# Patient Record
Sex: Female | Born: 1975 | Race: White | Hispanic: No | Smoking: Never smoker
Health system: Southern US, Community
[De-identification: ages and names within clinical notes are randomized; demographics above are authoritative.]

## PROBLEM LIST (undated history)

## (undated) DIAGNOSIS — R0600 Dyspnea, unspecified: Secondary | ICD-10-CM

## (undated) DIAGNOSIS — E039 Hypothyroidism, unspecified: Secondary | ICD-10-CM

## (undated) DIAGNOSIS — L659 Nonscarring hair loss, unspecified: Secondary | ICD-10-CM

## (undated) DIAGNOSIS — J45909 Unspecified asthma, uncomplicated: Secondary | ICD-10-CM

## (undated) DIAGNOSIS — E739 Lactose intolerance, unspecified: Secondary | ICD-10-CM

## (undated) DIAGNOSIS — R0609 Other forms of dyspnea: Secondary | ICD-10-CM

## (undated) DIAGNOSIS — F32A Depression, unspecified: Secondary | ICD-10-CM

## (undated) DIAGNOSIS — J309 Allergic rhinitis, unspecified: Secondary | ICD-10-CM

## (undated) DIAGNOSIS — F419 Anxiety disorder, unspecified: Secondary | ICD-10-CM

## (undated) DIAGNOSIS — E669 Obesity, unspecified: Secondary | ICD-10-CM

## (undated) DIAGNOSIS — E282 Polycystic ovarian syndrome: Secondary | ICD-10-CM

## (undated) DIAGNOSIS — M549 Dorsalgia, unspecified: Secondary | ICD-10-CM

## (undated) DIAGNOSIS — F329 Major depressive disorder, single episode, unspecified: Secondary | ICD-10-CM

## (undated) DIAGNOSIS — K219 Gastro-esophageal reflux disease without esophagitis: Secondary | ICD-10-CM

## (undated) HISTORY — DX: Gastro-esophageal reflux disease without esophagitis: K21.9

## (undated) HISTORY — DX: Obesity, unspecified: E66.9

## (undated) HISTORY — DX: Hypothyroidism, unspecified: E03.9

## (undated) HISTORY — DX: Nonscarring hair loss, unspecified: L65.9

## (undated) HISTORY — DX: Other forms of dyspnea: R06.09

## (undated) HISTORY — DX: Allergic rhinitis, unspecified: J30.9

## (undated) HISTORY — DX: Dorsalgia, unspecified: M54.9

## (undated) HISTORY — DX: Depression, unspecified: F32.A

## (undated) HISTORY — DX: Polycystic ovarian syndrome: E28.2

## (undated) HISTORY — DX: Unspecified asthma, uncomplicated: J45.909

## (undated) HISTORY — DX: Major depressive disorder, single episode, unspecified: F32.9

## (undated) HISTORY — DX: Anxiety disorder, unspecified: F41.9

## (undated) HISTORY — DX: Dyspnea, unspecified: R06.00

## (undated) HISTORY — DX: Lactose intolerance, unspecified: E73.9

---

## 2017-09-09 ENCOUNTER — Encounter (INDEPENDENT_AMBULATORY_CARE_PROVIDER_SITE_OTHER): Payer: Self-pay

## 2017-10-01 ENCOUNTER — Ambulatory Visit (INDEPENDENT_AMBULATORY_CARE_PROVIDER_SITE_OTHER): Payer: BLUE CROSS/BLUE SHIELD | Admitting: Family Medicine

## 2017-10-01 ENCOUNTER — Encounter (INDEPENDENT_AMBULATORY_CARE_PROVIDER_SITE_OTHER): Payer: Self-pay | Admitting: Family Medicine

## 2017-10-01 VITALS — BP 109/74 | HR 69 | Temp 97.8°F | Ht 64.0 in | Wt 210.0 lb

## 2017-10-01 DIAGNOSIS — Z1331 Encounter for screening for depression: Secondary | ICD-10-CM | POA: Diagnosis not present

## 2017-10-01 DIAGNOSIS — Z6836 Body mass index (BMI) 36.0-36.9, adult: Secondary | ICD-10-CM

## 2017-10-01 DIAGNOSIS — R0602 Shortness of breath: Secondary | ICD-10-CM | POA: Diagnosis not present

## 2017-10-01 DIAGNOSIS — Z0289 Encounter for other administrative examinations: Secondary | ICD-10-CM

## 2017-10-01 DIAGNOSIS — F3289 Other specified depressive episodes: Secondary | ICD-10-CM | POA: Diagnosis not present

## 2017-10-01 DIAGNOSIS — R5383 Other fatigue: Secondary | ICD-10-CM | POA: Diagnosis not present

## 2017-10-01 NOTE — Progress Notes (Signed)
.  Office: [phone]  /  Fax: [phone]   HPI:   Chief Complaint: OBESITY  Tara Prince (MR# [id number]) is a 41 y.o. female who presents on 10/01/2017 for obesity evaluation and treatment. Current BMI is Body mass index is 36.05 kg/m.Marland Kitchen Tara Prince has struggled with obesity for years and has been unsuccessful in either losing weight or maintaining long term weight loss. Tara Prince attended our information session and states she is currently in the action stage of change and ready to dedicate time achieving and maintaining a healthier weight.  Tara Prince has been followed by an Environmental manager and has tried weight loss medications before (Contrave and [place]). She doesn't want to count calories as she feels this makes her OCD worse. She has massive guilt about eating and her weight. Tara Prince feels she had bulimia in the past. She is exercising for 60 minutes 5 times per week. Tara Prince states her family eats meals together she thinks her family will eat healthier with  her she struggles with family and or coworkers weight loss sabotage her desired weight loss is 55 lbs she has been heavy most of her life she started gaining weight in elementary school her heaviest weight ever was 215 ish lbs. she has significant food cravings issues  she frequently makes poor food choices she has problems with excessive hunger  she frequently eats larger portions than normal  she has binge eating behaviors she struggles with emotional eating    Fatigue Tara Prince feels her energy is lower than it should be. This has worsened with weight gain and has not worsened recently. [name] admits to daytime somnolence and admits to waking up still tired. Patient is at risk for obstructive sleep apnea. Patent has a history of symptoms of daytime fatigue and morning fatigue. Patient generally gets 6 or 7 hours of sleep per night, and states they generally have restless sleep. Snoring is present. Apneic episodes are not present.  Epworth Sleepiness Score is 9  Dyspnea on exertion Tara Prince notes increasing shortness of breath with exercising and seems to be worsening over time with weight gain. She notes getting out of breath sooner with activity than she used to. This has not gotten worse recently. Tara Prince denies orthopnea.  Depression with Anxiety Tara Prince is on medications and is followed by a therapist. She has lots of emotional eating behaviors and guilt associated with food. Tara Prince struggles with emotional eating and using food for comfort to the extent that it is negatively impacting her health. She often snacks when she is not hungry. Tara Prince sometimes feels she is out of control and then feels guilty that she made poor food choices. She has been working on behavior modification techniques to help reduce her emotional eating and has been somewhat successful. She shows no sign of suicidal or homicidal ideations.  Depression screen PHQ 2/9 10/01/2017  Decreased Interest 2  Down, Depressed, Hopeless 2  PHQ - 2 Score 4  Altered sleeping 3  Tired, decreased energy 3  Change in appetite 3  Feeling bad or failure about yourself  2  Trouble concentrating 1  Moving slowly or fidgety/restless 0  Suicidal thoughts 0  PHQ-9 Score 16  Difficult doing work/chores Somewhat difficult          Depression Screen Tara Prince's Food and Mood (modified PHQ-9) score was  Depression screen PHQ 2/9 10/01/2017  Decreased Interest 2  Down, Depressed, Hopeless 2  PHQ - 2 Score 4  Altered sleeping 3  Tired, decreased energy 3  Change  in appetite 3  Feeling bad or failure about yourself  2  Trouble concentrating 1  Moving slowly or fidgety/restless 0  Suicidal thoughts 0  PHQ-9 Score 16  Difficult doing work/chores Somewhat difficult    ALLERGIES: Allergies  Allergen Reactions  . Amoxicillin Rash  . Septra [Sulfamethoxazole-Trimethoprim] Rash    MEDICATIONS: No current outpatient prescriptions on file prior to visit.    No current facility-administered medications on file prior to visit.     PAST MEDICAL HISTORY: Past Medical History:  Diagnosis Date  . Allergic rhinitis   . Alopecia   . Anxiety   . Asthma   . Back pain   . Depression   . Dyspnea on exertion   . GERD (gastroesophageal reflux disease)   . Hypothyroidism   . Lactose intolerance   . Obesity   . PCOS (polycystic ovarian syndrome)     PAST SURGICAL HISTORY: History reviewed. No pertinent surgical history.  SOCIAL HISTORY: Social History  Substance Use Topics  . Smoking status: Never Smoker  . Smokeless tobacco: Never Used  . Alcohol use Not on file    FAMILY HISTORY: Family History  Problem Relation Age of Onset  . Hypertension Mother   . Thyroid disease Mother   . Sleep apnea Mother   . Eating disorder Mother   . Obesity Mother   . Hypertension Father   . Depression Father   . Anxiety disorder Father   . Alcoholism Father   . Obesity Father     ROS: Review of Systems  Constitutional: Positive for malaise/fatigue.  HENT: Positive for congestion (nasal stuffiness).        Nasal Discharge Hay Fever  Eyes:       Wear Glasses or Contacts Floaters  Respiratory: Positive for shortness of breath (on exertion) and wheezing.   Cardiovascular: Negative for orthopnea.       Calf Pain with Walking Very Cold Hands or Feet  Gastrointestinal: Positive for diarrhea and heartburn.  Genitourinary: Positive for frequency.  Musculoskeletal: Positive for back pain and neck pain.       Neck Stiffness Muscle or Joint Pain  Skin:       Dryness Hair or Nail Changes (Alopecia)   Neurological: Positive for headaches (tension).  Endo/Heme/Allergies: Bruises/bleeds easily (easy bruising).       Positive polyphagia Positive Heat/Cold Intolerance  Psychiatric/Behavioral: Positive for depression. Negative for suicidal ideas. The patient is nervous/anxious (nervousness) and has insomnia.        Stress    PHYSICAL  EXAM: Blood pressure 109/74, pulse 69, temperature 97.8 F (36.6 C), temperature source Oral, height  (1.626 m), weight 210 lb (95.3 kg), last menstrual period 09/06/2017, SpO2 96 %. Body mass index is 36.05 kg/m. Physical Exam  Constitutional: She is oriented to person, place, and time. She appears well-developed and well-nourished.  Cardiovascular: Normal rate.   Pulmonary/Chest: Effort normal.  Abdominal: Soft.  Musculoskeletal: Normal range of motion.  Neurological: She is oriented to person, place, and time.  Skin: Skin is warm and dry.  Vitals reviewed.   RECENT LABS AND TESTS: BMET No results found for: NA, K, CL, CO2, GLUCOSE, BUN, CREATININE, CALCIUM, GFRNONAA, GFRAA No results found for: HGBA1C No results found for: INSULIN CBC No results found for: WBC, RBC, HGB, HCT, PLT, MCV, MCH, MCHC, RDW, LYMPHSABS, MONOABS, EOSABS, BASOSABS Iron/TIBC/Ferritin/ %Sat No results found for: IRON, TIBC, FERRITIN, IRONPCTSAT Lipid Panel  No results found for: CHOL, TRIG, HDL, CHOLHDL, VLDL, LDLCALC, LDLDIRECT Hepatic Function  Panel  No results found for: PROT, ALBUMIN, AST, ALT, ALKPHOS, BILITOT, BILIDIR, IBILI No results found for: TSH  ECG  shows NSR with a rate of 71 BPM INDIRECT CALORIMETER done today shows a VO2 of 272 and a REE of 1892. Her calculated basal metabolic rate is 9147 thus her basal metabolic rate is better than expected.    ASSESSMENT AND PLAN: Other fatigue - Plan: EKG 12-Lead, Vitamin B12, CBC With Differential, Comprehensive metabolic panel, Folate, Hemoglobin A1c, Insulin, random, Lipid Panel With LDL/HDL Ratio, VITAMIN D 25 Hydroxy (Vit-D Deficiency, Fractures)  Shortness of breath on exertion  Other depression - with anxiety  Depression screening  Class 2 severe obesity with serious comorbidity and body mass index (BMI) of 36.0 to 36.9 in adult, unspecified obesity type (HCC)  PLAN:  Fatigue Oluwatosin was informed that her fatigue may be related  to obesity, depression or many other causes. Labs will be ordered, and in the meanwhile Kaydense has agreed to work on diet, exercise and weight loss to help with fatigue. Proper sleep hygiene was discussed including the need for 7-8 hours of quality sleep each night. A sleep study was not ordered based on symptoms and Epworth score.  Dyspnea on exertion Amoura's shortness of breath appears to be obesity related and exercise induced. She has agreed to work on weight loss and gradually increase exercise to treat her exercise induced shortness of breath. If Scarlettrose follows our instructions and loses weight without improvement of her shortness of breath, we will plan to refer to pulmonology. We will monitor this condition regularly. Tanyiah agrees to this plan.  Depression with Anxiety We discussed behavior modification techniques today to help Annaleia deal with her emotional eating and depression. She has agreed to continue with therapy and we discussed situations to watch for, which may trigger worsening depression /anxiety and resulting unrelenting eating. Edessa agreed to follow up as directed.  Depression Screen Lauraann had a strongly positive depression screening. Depression is commonly associated with obesity and often results in emotional eating behaviors. We will monitor this closely and work on CBT to help improve the non-hunger eating patterns. Referral to Psychology may be required if no improvement is seen as she continues in our clinic.  Obesity Takisha is currently in the action stage of change and her goal is to continue with weight loss efforts She has agreed to follow the Category 3 plan +100 calories Elanda has been instructed to work up to a goal of 150 minutes of combined cardio and strengthening exercise per week for weight loss and overall health benefits. We discussed the following Behavioral Modification Strategies today: increasing lean protein intake, decreasing simple carbohydrates  , work on meal planning and easy cooking plans and emotional eating strategies  Eleora has agreed to follow up with our clinic in 2 weeks. She was informed of the importance of frequent follow up visits to maximize her success with intensive lifestyle modifications for her multiple health conditions. She was informed we would discuss her lab results at her next visit unless there is a critical issue that needs to be addressed sooner. Aliana agreed to keep her next visit at the agreed upon time to discuss these results.  I, [place] Crane, am acting as transcriptionist for Quillian Quince, MD  I have reviewed the above documentation for accuracy and completeness, and I agree with the above. -Quillian Quince, MD   OBESITY BEHAVIORAL INTERVENTION VISIT  Today's visit was # 1 out of 22.  Starting weight:  210 lbs Starting date: 10/01/17 Today's weight : 210 lbs Today's date: 10/01/2017 Total lbs lost to date: 0 (Patients must lose 7 lbs in the first 6 months to continue with counseling)   ASK: We discussed the diagnosis of obesity with Kristeen Miss today and Dorina Hoyer agreed to give [place] permission to discuss obesity behavioral modification therapy today.  ASSESS: Brynlei has the diagnosis of obesity and her BMI today is 36.03 Toneshia is in the action stage of change   ADVISE: Jaree was educated on the multiple health risks of obesity as well as the benefit of weight loss to improve her health. She was advised of the need for long term treatment and the importance of lifestyle modifications.  AGREE: Multiple dietary modification options and treatment options were discussed and  Jackelin agreed to follow the Category 3 plan +100 calories We discussed the following Behavioral Modification Strategies today: increasing lean protein intake, decreasing simple carbohydrates , work on meal planning and easy cooking plans and emotional eating strategies

## 2017-10-02 LAB — CBC WITH DIFFERENTIAL
BASOS ABS: 0 10*3/uL (ref 0.0–0.2)
Basos: 1 %
EOS (ABSOLUTE): 0.1 10*3/uL (ref 0.0–0.4)
Eos: 1 %
HEMATOCRIT: 42.9 % (ref 34.0–46.6)
Hemoglobin: 13.8 g/dL (ref 11.1–15.9)
Immature Grans (Abs): 0 10*3/uL (ref 0.0–0.1)
Immature Granulocytes: 1 %
LYMPHS ABS: 2 10*3/uL (ref 0.7–3.1)
Lymphs: 31 %
MCH: 30.7 pg (ref 26.6–33.0)
MCHC: 32.2 g/dL (ref 31.5–35.7)
MCV: 96 fL (ref 79–97)
MONOCYTES: 9 %
Monocytes Absolute: 0.6 10*3/uL (ref 0.1–0.9)
NEUTROS ABS: 3.8 10*3/uL (ref 1.4–7.0)
Neutrophils: 57 %
RBC: 4.49 x10E6/uL (ref 3.77–5.28)
RDW: 12.9 % (ref 12.3–15.4)
WBC: 6.5 10*3/uL (ref 3.4–10.8)

## 2017-10-02 LAB — COMPREHENSIVE METABOLIC PANEL
A/G RATIO: 1.7 (ref 1.2–2.2)
ALBUMIN: 4.7 g/dL (ref 3.5–5.5)
ALK PHOS: 78 IU/L (ref 39–117)
ALT: 16 IU/L (ref 0–32)
AST: 22 IU/L (ref 0–40)
BILIRUBIN TOTAL: 0.4 mg/dL (ref 0.0–1.2)
BUN/Creatinine Ratio: 12 (ref 9–23)
BUN: 13 mg/dL (ref 6–24)
CHLORIDE: 99 mmol/L (ref 96–106)
CO2: 26 mmol/L (ref 20–29)
Calcium: 10.2 mg/dL (ref 8.7–10.2)
Creatinine, Ser: 1.06 mg/dL — ABNORMAL HIGH (ref 0.57–1.00)
GFR calc non Af Amer: 65 mL/min/{1.73_m2} (ref >59)
GFR, EST AFRICAN AMERICAN: 75 mL/min/{1.73_m2} (ref >59)
GLOBULIN, TOTAL: 2.7 g/dL (ref 1.5–4.5)
GLUCOSE: 90 mg/dL (ref 65–99)
POTASSIUM: 4.2 mmol/L (ref 3.5–5.2)
Sodium: 138 mmol/L (ref 134–144)
Total Protein: 7.4 g/dL (ref 6.0–8.5)

## 2017-10-02 LAB — LIPID PANEL WITH LDL/HDL RATIO
CHOLESTEROL TOTAL: 150 mg/dL (ref 100–199)
HDL: 55 mg/dL (ref >39)
LDL Calculated: 84 mg/dL (ref 0–99)
LDl/HDL Ratio: 1.5 ratio (ref 0.0–3.2)
Triglycerides: 55 mg/dL (ref 0–149)
VLDL Cholesterol Cal: 11 mg/dL (ref 5–40)

## 2017-10-02 LAB — INSULIN, RANDOM: INSULIN: 6.6 u[IU]/mL (ref 2.6–24.9)

## 2017-10-02 LAB — FOLATE

## 2017-10-02 LAB — VITAMIN D 25 HYDROXY (VIT D DEFICIENCY, FRACTURES): VIT D 25 HYDROXY: 74 ng/mL (ref 30.0–100.0)

## 2017-10-02 LAB — HEMOGLOBIN A1C
ESTIMATED AVERAGE GLUCOSE: 114 mg/dL
Hgb A1c MFr Bld: 5.6 % (ref 4.8–5.6)

## 2017-10-02 LAB — VITAMIN B12: Vitamin B-12: 929 pg/mL (ref 232–1245)

## 2017-10-05 ENCOUNTER — Encounter (INDEPENDENT_AMBULATORY_CARE_PROVIDER_SITE_OTHER): Payer: Self-pay | Admitting: Family Medicine

## 2017-10-14 ENCOUNTER — Ambulatory Visit (INDEPENDENT_AMBULATORY_CARE_PROVIDER_SITE_OTHER): Payer: BLUE CROSS/BLUE SHIELD | Admitting: Family Medicine

## 2017-10-14 VITALS — BP 117/80 | HR 65 | Temp 98.2°F | Ht 64.0 in | Wt 209.0 lb

## 2017-10-14 DIAGNOSIS — E88819 Insulin resistance, unspecified: Secondary | ICD-10-CM | POA: Insufficient documentation

## 2017-10-14 DIAGNOSIS — Z6836 Body mass index (BMI) 36.0-36.9, adult: Secondary | ICD-10-CM

## 2017-10-14 DIAGNOSIS — E559 Vitamin D deficiency, unspecified: Secondary | ICD-10-CM

## 2017-10-14 DIAGNOSIS — E8881 Metabolic syndrome: Secondary | ICD-10-CM | POA: Diagnosis not present

## 2017-10-14 NOTE — Progress Notes (Signed)
Office: [phone]  /  Fax: [phone]   HPI:   Chief Complaint: OBESITY Tara Prince is here to discuss her progress with her obesity treatment plan. She is on the Category 3 plan and is following her eating plan approximately 90 to 95 % of the time. She states she is exercising [employer] and running for 60 minutes 4 to 5 times per week. Tara Prince struggled to follow her category 3 plan due to excessive hunger every day. She didn't like eating all her dairy and not all the protein in the pm (only ate approximately half of her dinner portion). Her weight is 209 lb (94.8 kg) today and has had a weight loss of 1 pound over a period of 2 weeks since her last visit. She has lost 1 lb since starting treatment with [place].  Vitamin D deficiency Tara Prince has a diagnosis of vitamin D deficiency. She is currently taking OTC vit D 5,000 IU daily and level is at goal. She still notes mild fatigue, likely unrelated to vitamin D and she denies nausea, vomiting or muscle weakness.  Insulin Resistance Tara Prince has a diagnosis of insulin resistance based on her very mildly elevated fasting insulin level >5 and positive family history of diabetes. Tara Prince is doing well with diet and exercise. Although Tara Prince's blood glucose readings are still under good control, insulin resistance puts her at greater risk of metabolic syndrome and diabetes. Tara Prince admits polyphagia. She is not taking metformin currently and continues to work on diet and exercise to decrease risk of diabetes.  ALLERGIES: Allergies  Allergen Reactions  . Amoxicillin Rash  . Septra [Sulfamethoxazole-Trimethoprim] Rash    MEDICATIONS: Current Outpatient Prescriptions on File Prior to Visit  Medication Sig Dispense Refill  . Ascorbic Acid (VITAMIN C) 1000 MG tablet Take 1,000 mg by mouth daily.    . B Complex Vitamins (B COMPLEX 100 PO) Take 1 tablet by mouth daily.    . beclomethasone (QVAR) 80 MCG/ACT inhaler Inhale 1 puff into the lungs 2  (two) times daily.    . busPIRone (BUSPAR) 5 MG tablet Take 5 mg by mouth daily as needed.    . Cholecalciferol (VITAMIN D3) 5000 units CAPS Take 1 capsule by mouth daily.    Marland Kitchen escitalopram (LEXAPRO) 20 MG tablet Take 20 mg by mouth daily.    Marland Kitchen levocetirizine (XYZAL) 5 MG tablet Take 5 mg by mouth every evening.    . Magnesium 500 MG CAPS Take 1 capsule by mouth daily.    . meloxicam (MOBIC) 15 MG tablet Take 15 mg by mouth daily.    . montelukast (SINGULAIR) 10 MG tablet Take 10 mg by mouth at bedtime.    . progesterone (PROMETRIUM) 200 MG capsule Take 200 mg by mouth daily.    . Quercetin 250 MG TABS Take 1 tablet by mouth daily.    Marland Kitchen spironolactone (ALDACTONE) 100 MG tablet Take 100 mg by mouth 2 (two) times daily.    . Thyroid 81.25 MG TABS Take 81.25 mg by mouth daily.    . TURMERIC CURCUMIN PO Take 400 mg by mouth daily.     No current facility-administered medications on file prior to visit.     PAST MEDICAL HISTORY: Past Medical History:  Diagnosis Date  . Allergic rhinitis   . Alopecia   . Anxiety   . Asthma   . Back pain   . Depression   . Dyspnea on exertion   . GERD (gastroesophageal reflux disease)   . Hypothyroidism   .  Lactose intolerance   . Obesity   . PCOS (polycystic ovarian syndrome)     PAST SURGICAL HISTORY: No past surgical history on file.  SOCIAL HISTORY: Social History  Substance Use Topics  . Smoking status: Never Smoker  . Smokeless tobacco: Never Used  . Alcohol use Not on file    FAMILY HISTORY: Family History  Problem Relation Age of Onset  . Hypertension Mother   . Thyroid disease Mother   . Sleep apnea Mother   . Eating disorder Mother   . Obesity Mother   . Hypertension Father   . Depression Father   . Anxiety disorder Father   . Alcoholism Father   . Obesity Father     ROS: Review of Systems  Constitutional: Positive for malaise/fatigue and weight loss.  Gastrointestinal: Negative for nausea and vomiting.    Musculoskeletal:       Negative muscle weakness    PHYSICAL EXAM: Blood pressure 117/80, pulse 65, temperature 98.2 F (36.8 C), temperature source Oral, height  (1.626 m), weight 209 lb (94.8 kg), last menstrual period 09/06/2017, SpO2 99 %. Body mass index is 35.87 kg/m. Physical Exam  Constitutional: She is oriented to person, place, and time. She appears well-developed and well-nourished.  Cardiovascular: Normal rate.   Pulmonary/Chest: Effort normal.  Musculoskeletal: Normal range of motion.  Neurological: She is oriented to person, place, and time.  Skin: Skin is warm and dry.  Psychiatric: She has a normal mood and affect. Her behavior is normal.  Vitals reviewed.   RECENT LABS AND TESTS: BMET    Component Value Date/Time   NA 138 10/01/2017 1035   K 4.2 10/01/2017 1035   CL 99 10/01/2017 1035   CO2 26 10/01/2017 1035   GLUCOSE 90 10/01/2017 1035   BUN 13 10/01/2017 1035   CREATININE 1.06 (H) 10/01/2017 1035   CALCIUM 10.2 10/01/2017 1035   GFRNONAA 65 10/01/2017 1035   GFRAA 75 10/01/2017 1035   Lab Results  Component Value Date   HGBA1C 5.6 10/01/2017   Lab Results  Component Value Date   INSULIN 6.6 10/01/2017   CBC    Component Value Date/Time   WBC 6.5 10/01/2017 1035   RBC 4.49 10/01/2017 1035   HGB 13.8 10/01/2017 1035   HCT 42.9 10/01/2017 1035   MCV 96 10/01/2017 1035   MCH 30.7 10/01/2017 1035   MCHC 32.2 10/01/2017 1035   RDW 12.9 10/01/2017 1035   LYMPHSABS 2.0 10/01/2017 1035   EOSABS 0.1 10/01/2017 1035   BASOSABS 0.0 10/01/2017 1035   Iron/TIBC/Ferritin/ %Sat No results found for: IRON, TIBC, FERRITIN, IRONPCTSAT Lipid Panel     Component Value Date/Time   CHOL 150 10/01/2017 1035   TRIG 55 10/01/2017 1035   HDL 55 10/01/2017 1035   LDLCALC 84 10/01/2017 1035   Hepatic Function Panel     Component Value Date/Time   PROT 7.4 10/01/2017 1035   ALBUMIN 4.7 10/01/2017 1035   AST 22 10/01/2017 1035   ALT 16 10/01/2017  1035   ALKPHOS 78 10/01/2017 1035   BILITOT 0.4 10/01/2017 1035   No results found for: TSH  ASSESSMENT AND PLAN: Insulin resistance  Vitamin D deficiency  Class 2 severe obesity with serious comorbidity and body mass index (BMI) of 36.0 to 36.9 in adult, unspecified obesity type (HCC)  PLAN:  Vitamin D Deficiency Tara Prince was informed that low vitamin D levels contributes to fatigue and are associated with obesity, breast, and colon cancer. She agrees to  continue to take OTC vitamin D 5,000 IU daily for now. We will recheck labs in 3 months and will follow up for routine testing of vitamin D, at least 2-3 times per year. She was informed of the risk of over-replacement of vitamin D and agrees to not increase her dose unless he discusses this with [place] first. We may need to decrease dose if her level continues to increase.  Insulin Resistance Tara Prince will continue to work on weight loss, exercise, and decreasing simple carbohydrates in her diet to help decrease the risk of diabetes. We dicussed metformin including benefits and risks. She was informed that eating too many simple carbohydrates or too many calories at one sitting increases the likelihood of GI side effects. We will adjust her plan to help with hunger and defer metformin for now and prescription was not written today but may need to start if polyphagia doesn't improve. Tara Prince agreed to follow up with [place] as directed to monitor her progress.  Obesity Tara Prince is currently in the action stage of change. As such, her goal is to continue with weight loss efforts She has agreed to follow the modified Category 3 plan with different protein options Daizee has been instructed to work up to a goal of 150 minutes of combined cardio and strengthening exercise per week or continue [employer] and running for 60 minutes 4 to 5 times per week for weight loss and overall health benefits. We discussed the following Behavioral Modification  Strategies today: keeping healthy foods in the home, increasing lean protein intake, decreasing simple carbohydrates and work on meal planning and easy cooking plans  Tara Prince has agreed to follow up with our clinic in 2 weeks. She was informed of the importance of frequent follow up visits to maximize her success with intensive lifestyle modifications for her multiple health conditions.  I, [place] Crane, am acting as transcriptionist for Quillian Quince, MD  I have reviewed the above documentation for accuracy and completeness, and I agree with the above. -Quillian Quince, MD   OBESITY BEHAVIORAL INTERVENTION VISIT  Today's visit was # 2 out of 22.  Starting weight: 210 lbs Starting date: 10/01/17 Today's weight : 209 lbs Today's date: 10/14/2017 Total lbs lost to date: 1 (Patients must lose 7 lbs in the first 6 months to continue with counseling)   ASK: We discussed the diagnosis of obesity with Tara Prince today and Tara Prince agreed to give [place] permission to discuss obesity behavioral modification therapy today.  ASSESS: Tara Prince has the diagnosis of obesity and her BMI today is 35.86 Tara Prince is in the action stage of change   ADVISE: Tara Prince was educated on the multiple health risks of obesity as well as the benefit of weight loss to improve her health. She was advised of the need for long term treatment and the importance of lifestyle modifications.  AGREE: Multiple dietary modification options and treatment options were discussed and  Tara Prince agreed to follow the modified Category 3 plan with different protein options We discussed the following Behavioral Modification Strategies today: keeping healthy foods in the home, increasing lean protein intake, decreasing simple carbohydrates  and work on meal planning and easy cooking plans

## 2017-10-16 ENCOUNTER — Telehealth: Payer: Self-pay

## 2017-10-16 ENCOUNTER — Telehealth: Payer: Self-pay | Admitting: *Deleted

## 2017-10-16 ENCOUNTER — Encounter: Payer: Self-pay | Admitting: Allergy and Immunology

## 2017-10-16 ENCOUNTER — Ambulatory Visit (INDEPENDENT_AMBULATORY_CARE_PROVIDER_SITE_OTHER): Payer: BLUE CROSS/BLUE SHIELD | Admitting: Allergy and Immunology

## 2017-10-16 VITALS — BP 118/80 | HR 72 | Temp 98.0°F | Resp 16 | Ht 64.69 in | Wt 212.3 lb

## 2017-10-16 DIAGNOSIS — R0609 Other forms of dyspnea: Secondary | ICD-10-CM | POA: Insufficient documentation

## 2017-10-16 DIAGNOSIS — J4540 Moderate persistent asthma, uncomplicated: Secondary | ICD-10-CM

## 2017-10-16 DIAGNOSIS — J3089 Other allergic rhinitis: Secondary | ICD-10-CM

## 2017-10-16 MED ORDER — MOMETASONE FUROATE 50 MCG/ACT NA SUSP: NASAL | 5 refills | Status: DC

## 2017-10-16 MED ORDER — BUDESONIDE-FORMOTEROL FUMARATE 160-4.5 MCG/ACT IN AERO: 2.0000 | INHALATION_SPRAY | Freq: Two times a day (BID) | RESPIRATORY_TRACT | 5 refills | Status: DC

## 2017-10-16 NOTE — Telephone Encounter (Signed)
Patient informed of change of medication

## 2017-10-16 NOTE — Telephone Encounter (Signed)
-----   Message from Cristal Fordalph Carter Bobbitt, MD sent at 10/16/2017  1:23 PM EDT ----- Please call the patient and inform her that Elwin SleightDulera is not covered by her insurance so we have instead written a prescription for Symbicort 160-4.5 g, 2 inhalations via spacer device twice a day.  Thank you.

## 2017-10-16 NOTE — Assessment & Plan Note (Signed)
   Aeroallergen avoidance measures have been discussed and provided in written form.  A prescription has been provided for levocetirizine, 5mg  daily as needed.  To avoid diminishing benefit with daily use (tachyphylaxis) of second generation antihistamine, consider alternating every few months between fexofenadine (Allegra) and levocetirizine (Xyzal).  A prescription has been provided for Nasonex nasal spray, one spray per nostril 1-2 times daily as needed. Proper nasal spray technique has been discussed and demonstrated.  I have also recommended nasal saline spray (i.e., Simply Saline) or nasal saline lavage (i.e., NeilMed) as needed and prior to medicated nasal sprays.  If allergen avoidance measures and medications fail to adequately relieve symptoms, aeroallergen immunotherapy will be considered.

## 2017-10-16 NOTE — Progress Notes (Signed)
New Patient Note  RE: Tara Prince MRN: [id number] DOB: [birth year] Date of Office Visit: 10/16/2017  Referring provider: Isabella Bowens, PA* Primary care provider: Isabella Bowens, PA-C  Chief Complaint: Breathing Problem and Allergic Rhinitis    History of present illness: Tara Prince is a 41 y.o. female seen today in consultation requested by Tara Bowens, PA-C. She reports that over the past 3 years she has experienced dyspnea, coughing, and occasional wheezing.  These lower respiratory symptoms are consistently triggered by exercise and are more pronounced in hot and cold weather.  Earlier this year, she had 2 "asthma attacks" during hot weather with speed training workouts.  Albuterol provides temporary relief.  At times, she has difficulty "getting enough air in" her lungs and feels like she has to repeatedly yawn in an attempt to do so.  She is currently taking montelukast 10 mg daily and albuterol HFA prior to exercise and as needed.  She has briefly tried Qvar and Breo Ellipta (dry powder inhaler) and Advair (dry powder inhaler) without adequate symptom relief. [name] experiences "chronic runny nose", as well as nasal congestion, thick postnasal drainage, sneezing, and nasal pruritus.  The symptoms occur year round and may be triggered by exposure to dander.  She is currently taking loratadine 20 mg daily, and has done so on a daily basis for the past few years, without adequate symptom relief.   Assessment and plan: Asthma/exercise-induced bronchospasm Currently with suboptimal control.  A prescription has been provided for [facility] (mometasone/formoterol) 200/5 g,  2 inhalations twice a day. To maximize pulmonary deposition, a spacer has been provided along with instructions for its proper administration with an HFA inhaler.  For now, continue montelukast 10 mg daily at bedtime and albuterol HFA, 1-2 inhalations every 4-6 hours if needed and 15 minutes prior to  exercise.  Subjective and objective measures of pulmonary function will be followed and the treatment plan will be adjusted accordingly.  Other form of dyspnea The patient's sensation of air-hunger, not being able to get a full breath on inspiration, which is relieved by a yawn suggests sighing dyspnea. Less likely is vocal cord dysfunction.   Diaphragmatic breathing, or belly breathing, has been discussed with the patient as this technique often times relieves sighing dyspnea.  Continue to have access to albuterol HFA, if needed.  Allergic rhinitis  Aeroallergen avoidance measures have been discussed and provided in written form.  A prescription has been provided for levocetirizine, 5mg  daily as needed.  To avoid diminishing benefit with daily use (tachyphylaxis) of second generation antihistamine, consider alternating every few months between fexofenadine (Allegra) and levocetirizine (Xyzal).  A prescription has been provided for Nasonex nasal spray, one spray per nostril 1-2 times daily as needed. Proper nasal spray technique has been discussed and demonstrated.  I have also recommended nasal saline spray (i.e., Simply Saline) or nasal saline lavage (i.e., NeilMed) as needed and prior to medicated nasal sprays.  If allergen avoidance measures and medications fail to adequately relieve symptoms, aeroallergen immunotherapy will be considered.   Meds ordered this encounter  Medications  . mometasone (NASONEX) 50 MCG/ACT nasal spray    Sig: One spray in each nostril 1-2 times a day as needed    Dispense:  17 g    Refill:  5  . budesonide-formoterol (SYMBICORT) 160-4.5 MCG/ACT inhaler    Sig: Inhale 2 puffs into the lungs 2 (two) times daily.    Dispense:  1 Inhaler    Refill:  5    Diagnostics: Spirometry: FVC is 4.05 L and FEV1 is 3.09 L with an FEV1 ratio of 94%.  There is not significant postbronchodilator improvement. This study was performed while the patient was  asymptomatic.  Please see scanned spirometry results for details. Epicutaneous testing:  Negative despite a positive histamine control. Intradermal testing: Positive to molds, dog epithelia, cockroach antigen, and dust mite antigen.    Physical examination: Blood pressure 118/80, pulse 72, temperature 98 F (36.7 C), temperature source Oral, resp. rate 16, height 5' 4.69" (1.643 m), weight 212 lb 4.9 oz (96.3 kg), SpO2 98 %.  General: Alert, interactive, in no acute distress. HEENT: TMs pearly gray, turbinates edematous with clear discharge, post-pharynx moderately erythematous. Neck: Supple without lymphadenopathy. Lungs: Clear to auscultation without wheezing, rhonchi or rales. CV: Normal S1, S2 without murmurs. Abdomen: Nondistended, nontender. Skin: Warm and dry, without lesions or rashes. Extremities:  No clubbing, cyanosis or edema. Neuro:   Grossly intact.  Review of systems:  Review of systems negative except as noted in HPI / PMHx or noted below: Review of Systems  Constitutional: Negative.   HENT: Negative.   Eyes: Negative.   Respiratory: Negative.   Cardiovascular: Negative.   Gastrointestinal: Negative.   Genitourinary: Negative.   Musculoskeletal: Negative.   Skin: Negative.   Neurological: Negative.   Endo/Heme/Allergies: Negative.   Psychiatric/Behavioral: Negative.     Past medical history:  Past Medical History:  Diagnosis Date  . Allergic rhinitis   . Alopecia   . Anxiety   . Asthma   . Back pain   . Depression   . Dyspnea on exertion   . GERD (gastroesophageal reflux disease)   . Hypothyroidism   . Lactose intolerance   . Obesity   . PCOS (polycystic ovarian syndrome)     Past surgical history:  History reviewed. No pertinent surgical history.  Family history: Family History  Problem Relation Age of Onset  . Hypertension Mother   . Thyroid disease Mother   . Sleep apnea Mother   . Eating disorder Mother   . Obesity Mother   .  Hypertension Father   . Depression Father   . Anxiety disorder Father   . Alcoholism Father   . Obesity Father     Social history: Social History   Social History  . Marital status: Media plannerDomestic Partner    Spouse name: Wiliam  . Number of children: N/A  . Years of education: N/A   Occupational History  . Compliance officer [employer]   Social History Main Topics  . Smoking status: Never Smoker  . Smokeless tobacco: Never Used  . Alcohol use Not on file  . Drug use: Unknown  . Sexual activity: Not on file   Other Topics Concern  . Not on file   Social History Narrative  . No narrative on file   Environmental History: The patient lives in a 41 year old townhouse with carpeting throughout, gas heat, and central air.  There is no known mold/water damage in the home.  She is a nonsmoker.  There is a dog in the townhouse which has access to her bedroom.  Allergies as of 10/16/2017      Reactions   Amoxicillin Rash   Septra [sulfamethoxazole-trimethoprim] Rash      Medication List       Accurate as of 10/16/17  1:26 PM. Always use your most recent med list.          albuterol 108 (90 Base) MCG/ACT  inhaler Commonly known as:  PROVENTIL HFA;VENTOLIN HFA Inhale into the lungs.   B COMPLEX 100 PO Take 1 tablet by mouth daily.   beclomethasone 80 MCG/ACT inhaler Commonly known as:  QVAR Inhale 1 puff into the lungs 2 (two) times daily.   beclomethasone 80 MCG/ACT inhaler Commonly known as:  QVAR Inhale into the lungs.   Biotin 1000 MCG tablet Take by mouth.   budesonide-formoterol 160-4.5 MCG/ACT inhaler Commonly known as:  SYMBICORT Inhale 2 puffs into the lungs 2 (two) times daily.   busPIRone 5 MG tablet Commonly known as:  BUSPAR Take 5 mg by mouth daily as needed.   escitalopram 20 MG tablet Commonly known as:  LEXAPRO Take 20 mg by mouth daily.   levocetirizine 5 MG tablet Commonly known as:  XYZAL Take 5 mg by mouth every evening.     loratadine 10 MG tablet Commonly known as:  CLARITIN Take 20 mg by mouth.   Magnesium 500 MG Caps Take 1 capsule by mouth daily.   meloxicam 15 MG tablet Commonly known as:  MOBIC Take 15 mg by mouth daily.   mometasone 50 MCG/ACT nasal spray Commonly known as:  NASONEX One spray in each nostril 1-2 times a day as needed   montelukast 10 MG tablet Commonly known as:  SINGULAIR Take 10 mg by mouth at bedtime.   MULTI-VITAMINS Tabs Take by mouth.   progesterone 200 MG capsule Commonly known as:  PROMETRIUM Take 200 mg by mouth daily.   Quercetin 250 MG Tabs Take 1 tablet by mouth daily.   spironolactone 100 MG tablet Commonly known as:  ALDACTONE Take 100 mg by mouth 2 (two) times daily.   Thyroid 81.25 MG Tabs Take 81.25 mg by mouth daily.   TURMERIC CURCUMIN PO Take 400 mg by mouth daily.   vitamin C 1000 MG tablet Take 1,000 mg by mouth daily.   Vitamin D3 5000 units Caps Take 1 capsule by mouth daily.       Known medication allergies: Allergies  Allergen Reactions  . Amoxicillin Rash  . Septra [Sulfamethoxazole-Trimethoprim] Rash    I appreciate the opportunity to take part in Zionah's care. Please do not hesitate to contact me with questions.  Sincerely,   R. Jorene Guest, MD

## 2017-10-16 NOTE — Telephone Encounter (Signed)
-----   Message from Ralph Carter Bobbitt, MD sent at 10/16/2017  1:23 PM EDT ----- Please call the patient and inform her that Dulera is not covered by her insurance so we have instead written a prescription for Symbicort 160-4.5 g, 2 inhalations via spacer device twice a day.  Thank you. 

## 2017-10-16 NOTE — Assessment & Plan Note (Signed)
The patient's sensation of air-hunger, not being able to get a full breath on inspiration, which is relieved by a yawn suggests sighing dyspnea. Less likely is vocal cord dysfunction.   Diaphragmatic breathing, or belly breathing, has been discussed with the patient as this technique often times relieves sighing dyspnea.  Continue to have access to albuterol HFA, if needed.

## 2017-10-16 NOTE — Patient Instructions (Addendum)
Asthma/exercise-induced bronchospasm Currently with suboptimal control.  A prescription has been provided for [facility] (mometasone/formoterol) 200/5 g,  2 inhalations twice a day. To maximize pulmonary deposition, a spacer has been provided along with instructions for its proper administration with an HFA inhaler.  For now, continue montelukast 10 mg daily at bedtime and albuterol HFA, 1-2 inhalations every 4-6 hours if needed and 15 minutes prior to exercise.  Subjective and objective measures of pulmonary function will be followed and the treatment plan will be adjusted accordingly.  Other form of dyspnea The patient's sensation of air-hunger, not being able to get a full breath on inspiration, which is relieved by a yawn suggests sighing dyspnea. Less likely is vocal cord dysfunction.   Diaphragmatic breathing, or belly breathing, has been discussed with the patient as this technique often times relieves sighing dyspnea.  Continue to have access to albuterol HFA, if needed.  Allergic rhinitis  Aeroallergen avoidance measures have been discussed and provided in written form.  A prescription has been provided for levocetirizine, 5mg  daily as needed.  To avoid diminishing benefit with daily use (tachyphylaxis) of second generation antihistamine, consider alternating every few months between fexofenadine (Allegra) and levocetirizine (Xyzal).  A prescription has been provided for Nasonex nasal spray, one spray per nostril 1-2 times daily as needed. Proper nasal spray technique has been discussed and demonstrated.  I have also recommended nasal saline spray (i.e., Simply Saline) or nasal saline lavage (i.e., NeilMed) as needed and prior to medicated nasal sprays.  If allergen avoidance measures and medications fail to adequately relieve symptoms, aeroallergen immunotherapy will be considered.   Return in about 3 months (around 01/16/2018), or if symptoms worsen or fail to  improve.   Control of House Dust Mite Allergen  House dust mites play a major role in allergic asthma and rhinitis.  They occur in environments with high humidity wherever human skin, the food for dust mites is found. High levels have been detected in dust obtained from mattresses, pillows, carpets, upholstered furniture, bed covers, clothes and soft toys.  The principal allergen of the house dust mite is found in its feces.  A gram of dust may contain 1,000 mites and 250,000 fecal particles.  Mite antigen is easily measured in the air during house cleaning activities.    1. Encase mattresses, including the box spring, and pillow, in an air tight cover.  Seal the zipper end of the encased mattresses with wide adhesive tape. 2. Wash the bedding in water of 130 degrees Farenheit weekly.  Avoid cotton comforters/quilts and flannel bedding: the most ideal bed covering is the dacron comforter. 3. Remove all upholstered furniture from the bedroom. 4. Remove carpets, carpet padding, rugs, and non-washable window drapes from the bedroom.  Wash drapes weekly or use plastic window coverings. 5. Remove all non-washable stuffed toys from the bedroom.  Wash stuffed toys weekly. 6. Have the room cleaned frequently with a vacuum cleaner and a damp dust-mop.  The patient should not be in a room which is being cleaned and should wait 1 hour after cleaning before going into the room. 7. Close and seal all heating outlets in the bedroom.  Otherwise, the room will become filled with dust-laden air.  An electric heater can be used to heat the room. Reduce indoor humidity to less than 50%.  Do not use a humidifier.  Control of Dog or Cat Allergen  Avoidance is the best way to manage a dog or cat allergy. If you have a  dog or cat and are allergic to dog or cats, consider removing the dog or cat from the home. If you have a dog or cat but don't want to find it a new home, or if your family wants a pet even though  someone in the household is allergic, here are some strategies that may help keep symptoms at bay:  1. Keep the pet out of your bedroom and restrict it to only a few rooms. Be advised that keeping the dog or cat in only one room will not limit the allergens to that room. 2. Don't pet, hug or kiss the dog or cat; if you do, wash your hands with soap and water. 3. High-efficiency particulate air (HEPA) cleaners run continuously in a bedroom or living room can reduce allergen levels over time. 4. Place electrostatic material sheet in the air inlet vent in the bedroom. 5. Regular use of a high-efficiency vacuum cleaner or a central vacuum can reduce allergen levels. 6. Giving your dog or cat a bath at least once a week can reduce airborne allergen.  Control of Mold Allergen  Mold and fungi can grow on a variety of surfaces provided certain temperature and moisture conditions exist.  Outdoor molds grow on plants, decaying vegetation and soil.  The major outdoor mold, Alternaria and Cladosporium, are found in very high numbers during hot and dry conditions.  Generally, a late Summer - Fall peak is seen for common outdoor fungal spores.  Rain will temporarily lower outdoor mold spore count, but counts rise rapidly when the rainy period ends.  The most important indoor molds are Aspergillus and Penicillium.  Dark, humid and poorly ventilated basements are ideal sites for mold growth.  The next most common sites of mold growth are the bathroom and the kitchen.  Outdoor MicrosoftMold Control 7. Use air conditioning and keep windows closed 8. Avoid exposure to decaying vegetation. 9. Avoid leaf raking. 10. Avoid grain handling. 11. Consider wearing a face mask if working in moldy areas.  Indoor Mold Control 2. Maintain humidity below 50%. 3. Clean washable surfaces with 5% bleach solution. 4. Remove sources e.g. Contaminated carpets.  Control of Cockroach Allergen  Cockroach allergen has been identified as an  important cause of acute attacks of asthma, especially in urban settings.  There are fifty-five species of cockroach that exist in the [place], however only three, the [place], [place] and Oriental species produce allergen that can affect patients with Asthma.  Allergens can be obtained from fecal particles, egg casings and secretions from cockroaches.    2. Remove food sources. 3. Reduce access to water. 4. Seal access and entry points. 5. Spray runways with 0.5-1% Diazinon or Chlorpyrifos 6. Blow boric acid power under stoves and refrigerator. 7. Place bait stations (hydramethylnon) at feeding sites.

## 2017-10-16 NOTE — Assessment & Plan Note (Signed)
Currently with suboptimal control.  [facility]ol[facility] 200/5 g, 2 inhalations twice a day. To maximize pulmonary deposition, a spacer has been provided along with instructions for its proper administration with an HFA inhaler.  For now, continue montelukast 10 mg daily at bedtime and albuterol HFA, 1-2 inhalations every 4-6 hours if needed and 15 minutes prior to exercise.  Subjective and objective measures of pulmonary function will be followed and the treatment plan will be adjusted accordingly.

## 2017-10-17 NOTE — Telephone Encounter (Signed)
Lm for pt to call us back about the change in meds

## 2017-10-17 NOTE — Telephone Encounter (Signed)
Spoke with pt informed her we sent Symbicort in and I went over how to use spacer device.

## 2017-10-29 ENCOUNTER — Ambulatory Visit (INDEPENDENT_AMBULATORY_CARE_PROVIDER_SITE_OTHER): Payer: BLUE CROSS/BLUE SHIELD | Admitting: Family Medicine

## 2017-11-10 ENCOUNTER — Telehealth: Payer: Self-pay | Admitting: Allergy

## 2017-11-10 NOTE — Telephone Encounter (Signed)
Dr. Geoffery SpruceBobbitt,we don't have any Dulera 200. I asked Eads to send us some

## 2017-11-10 NOTE — Telephone Encounter (Signed)
Patient said you endoscopy on Friday During the procdure had a Asthma attack and they  reschuled the colonoscopy on Monday at hospital. Patient is worried about having another asthma attacked during this procdure Patient would like for you to call her. Phone [phone]. She was really concerned  About having this done. Said Symbicort seemed like it might not be helping. Thank you

## 2017-11-10 NOTE — Telephone Encounter (Signed)
She will be coming by thew [facility] office to pick up Dulera 200 sample -take 2 inhalations via spacer device twice daily -as well as prednisone 30 mg daily times 4 days.  She has her colonoscopy on Monday so she will take the prednisone Saturday, Sunday, Monday, and Tuesday.  Please have those things ready for her at the [facility] office.  Also, do a prior authorization for Dulera 200 as she has failed Brio Ellipta, Qvar, Advair, and Symbicort..  Thank you.  Thank you.

## 2017-11-11 NOTE — Telephone Encounter (Signed)
Dr. Nunzio CobbsBobbitt wpuld you please bring  some Dulera 200 samples [facility] you tomorrow for Kristeen MissJoanie Bradfield. Thanks so much.

## 2017-11-12 NOTE — Telephone Encounter (Signed)
Left message for patient that [facility] and the prednisone. She can pick it  Up.

## 2018-01-22 ENCOUNTER — Ambulatory Visit: Payer: BLUE CROSS/BLUE SHIELD | Admitting: Allergy and Immunology

## 2021-11-02 ENCOUNTER — Other Ambulatory Visit: Payer: Self-pay

## 2021-11-02 ENCOUNTER — Ambulatory Visit: Payer: BC Managed Care – PPO | Admitting: Internal Medicine

## 2021-11-02 ENCOUNTER — Encounter: Payer: Self-pay | Admitting: Internal Medicine

## 2021-11-02 VITALS — BP 122/80 | HR 86 | Temp 97.5°F | Resp 18 | Ht 65.5 in | Wt 209.6 lb

## 2021-11-02 DIAGNOSIS — T7819XA Other adverse food reactions, not elsewhere classified, initial encounter: Secondary | ICD-10-CM

## 2021-11-02 DIAGNOSIS — J45990 Exercise induced bronchospasm: Secondary | ICD-10-CM

## 2021-11-02 DIAGNOSIS — E739 Lactose intolerance, unspecified: Secondary | ICD-10-CM

## 2021-11-02 DIAGNOSIS — J383 Other diseases of vocal cords: Secondary | ICD-10-CM

## 2021-11-02 DIAGNOSIS — T7800XA Anaphylactic reaction due to unspecified food, initial encounter: Secondary | ICD-10-CM | POA: Diagnosis not present

## 2021-11-02 DIAGNOSIS — T781XXA Other adverse food reactions, not elsewhere classified, initial encounter: Secondary | ICD-10-CM | POA: Diagnosis not present

## 2021-11-02 DIAGNOSIS — J4520 Mild intermittent asthma, uncomplicated: Secondary | ICD-10-CM | POA: Diagnosis not present

## 2021-11-02 DIAGNOSIS — J3089 Other allergic rhinitis: Secondary | ICD-10-CM | POA: Diagnosis not present

## 2021-11-02 MED ORDER — IPRATROPIUM BROMIDE 0.03 % NA SOLN: 2.0000 | Freq: Three times a day (TID) | NASAL | 12 refills | Status: AC | PRN

## 2021-11-02 MED ORDER — EPINEPHRINE 0.3 MG/0.3ML IJ SOAJ: 0.3000 mg | Freq: Once | INTRAMUSCULAR | 2 refills | Status: AC

## 2021-11-02 NOTE — Patient Instructions (Addendum)
Chronic Rhinitis: seasonal and perennial: - allergy testing today was positive to grasses, weeds, ragweed, trees, molds, dust mites, dog, cockroach and mixed feathers - allergen avoidance as below - consider allergy shots as long term control of your symptoms by teaching your immune system to be more tolerant of your allergy triggers - Start Nasal Steroid Spray: Options include Flonase (fluticasone), Nasocort (triamcinolone), Nasonex (mometasome) 1- 2 sprays in each nostril daily (can buy over-the-counter if not covered by insurance)  Best results if used daily. - Start Atrovent (Ipratropium Bromide) 1-2 sprays in each nostril up to 3 times a day as needed for runny nose/post nasal drip/drainage.  Use less frequently if airway gets too dry. - Continue Singulair (Montelukast) 10mg  daily  - Continue over the counter antihistamine daily or daily as needed.   -Your options include Zyrtec (Cetirizine) 10mg , Claritin (Loratadine) 10mg , Allegra (Fexofenadine) 180mg , or Xyzal (Levocetirinze) 5mg   Exercise Induced Asthma + VCD overlap: - your lung testing today looked good - Use Albuterol (Proair/Ventolin) 2 puffs every 4-6 hours as needed for chest tightness, wheezing, or coughing and 2 puffs 15 minutes prior to exercise if you have symptoms with activity - Use a spacer with all inhalers - Asthma is not controlled if:  - Symptoms are occurring >2 times a week OR  - >2 times a month nighttime awakenings  - Please call the clinic to schedule a follow up if these symptoms arise -For symptoms occurring with strong smells or chemicals or that occur abruptly and feel like trouble breathing in, use vocal cord exercises as below.  If no improvement in 5 minutes use albuterol inhaler.  Food allergy:  - today's skin testing was negative to shellfish - please strictly avoid calamari (squid) and octopus (okay to continue eating the other shellfish you are already tolerating) - will test for squid and octupus in  blood and if negative, can do oral challenge - for SKIN only reaction, okay to take Benadryl 2 capsules every 4 hours - for SKIN + ANY additional symptoms, OR IF concern for LIFE THREATENING reaction = Epipen Autoinjector EpiPen 0.3 mg. - If using Epinephrine autoinjector, call 911 - A food allergy action plan has been provided and discussed. - Medic Alert identification is recommended. ----------------------------------------------- Vocal Cord Dysfunction (VCD) /Inspiratory Laryngeal Obstruction (ILO)/ Exercise- Induced Laryngeal Obstruction Exercises (EILO)  Practice these techniques several times a day, so that when you need them, they will be automatic, and will work right away (or very fast) to open up the spasming (closed) vocal cords.  PREPARATION: ? Loosen clothing at waist, so nothing is tight or snug. Open top buttons of pants or skirt, unzip pant or skirt zippers, pull shirt out over pants or skirt. This prevents pressure on the stomach, which can cause stomach reflux (backup of corrosive liquid) into the esophagus. Gastric reflux is a frequent cause of VCD attacks. ? Sit in a comfortable chair. When you need to use these techniques, you may be standing, lying, or sitting-BUT first try to learn them while sitting because they are easier to learn in this position. ? Keep water handy. Take sips, so your mouth and throat will not dry out. Swallow carefully, to avoid choking. ? Put your right (or left) thumb on your belly button, with the rest of your fingers below the thumb, so you are GENTLY touching your belly (lower abdomen). ? Doing this will focus your attention on your lower abdominal muscles. ? Relax your chest. Relax your shoulders. Relax your  neck. Relax your throat. This helps you to try to use ONLY your belly (lower abdomen) muscles. Consciously try NOT to use chest muscles, etc. Chest muscle use seems to irritate vocal cords while "belly" muscles tend to relax  them.  BREATHE OUT (EXHALE) FIRST WITH slightly PURSED LIPS: ? Since most people cannot inhale, (breathe in), during VCD attacks, please start by breathing out (exhaling) in the special way described below, and this will usually open up the vocal cords, immediately. ? Start, by breathing out (exhaling) with lips "pursed" as if you are trying to gently blow out a candle. ? This is similar to whistling, but your lips will not be as "puckered' as when whistling and your lips will not be pushed forward, like when whistling. If you look in a mirror, you would see a mostly horizontal line of space between the lips, while you exhale the 'ffffffffffffffffffff'. ? This may be silent, or may sound like a gentle wind or breeze, like 'fffffffffffffff' and your lips should be symmetrical, around your teeth. You lower lip will not be touching your upper teeth, like when someone says the name FFFFFFFRank. This is one continuous flow of exhaled air, either silent, or making a slightly windy sound. ? Feel your hand on your belly (lower abdomen) come IN, a little bit toward your back, as you are 'working' only your lower belly muscles. ? This abdominal exhaled 'fffffffffffffffff' alone often stops VCD attacks very quickly.  ? Some prefer to try a variation of exhaling 'ffffffffffff'. Try exhaling quickly, 'ffff', 'fffff', 'ffff'--all part of one exhalation. Do not inhale in between quick "fff's. This helps some people more quickly open up the spasming vocal cords. This is like blowing out a slightly stubborn candle, exhaling against a tiny bit of resistance (like trying to blow out a trick birthday candle). ? If any of this helps, try to slow down the exhalations, and gently exhale 'ffffffffffffffffff'. ? Some people prefer to exhale gently 'ssssssssssssssss' or 'shhhhhhhhhhhhh' rather than 'fffffffffffffff', etc. Choose what works best in your case, or what is most comfortable for you. ? You may need to  repeat exhaling 'ffffffffffffff' (or 'ffff', 'ffff', 'ffff'), etc, several times to relax the spasming vocal cords. Repeating this often stops a VCD attack so that you can inhale (breathe in) again.

## 2021-11-02 NOTE — Progress Notes (Signed)
NEW PATIENT Date of Service/Encounter:  11/02/21 Referring provider: Osie Cheeks, PA* Primary care provider: Osie Cheeks, PA-C  Subjective:  Tara Prince is a 45 y.o. female with a PMHx of exercise-induced asthma, allergic rhinitis, insulin resistance, PCOS, vitamin D deficiency presenting today for evaluation of allergic rhinitis. History obtained from: chart review and patient.   Patient was a former patient of Dr. Verlin Fester last seen in October 2018.  Diagnosed with exercise-induced asthma provided Dulera 200, continued on montelukast, and albuterol as needed.  Also diagnosed with sighing dyspnea versus vocal cord dysfunction.  She was also diagnosed with allergic rhinitis started on levocetirizine, Nasonex, and simply saline. --------------------------------------------------------------------- Today she presents as a new patient. Since her last visit, she has seen another allergist, and had repeat negative allergy testing. Alternates Xyzal and Zyrtec and Singulair, but despite this, she continues to have snotty nose and is uncontrolled.  She has a dog, and this bothers her.  Regarding asthma, she also has exercise induced asthma.  Has tried daily controllers that she never felt helped her.  She has tried QVAR, [employer], [employer] and [place].  These have never reduced her need for daily rescue inhaler.  She was doing [employer] and running, but she hurt her back and is now doing piliates so using rescue inhaler less.  She went recently on a bike ride and following this felt short of breath and coughed for 2 days subsequently.  She coughs some upon awakening (clearing phlegm out of her lungs).  Exercise and exertion causes coughing if she doesn't use her inhaler.   She does have a lot of post nasal drainage. She has never been diagnosed with reflux, denies symptoms.  Denies hoarseness or globus sensation.  Concern for food allergy:  Ate calamari and developed intense itching  without hives.  Lasted about 4 hours until taking Benadryl.  Mussels also cause her to itch.  She is able to octopus and did not have symptoms.  She eats crustaceans and oysters without symptoms.  She tolerates finned fish. Dairy makes her stomach upset.  She can tolerate lactose free dairy and cheese.  Lactaid doesn't seem to help much. Has been told to avoid cherries, flaxseed and gluten due to food panel (IgG) obtained by PCP.  Denies reaction to these.   Previous Diagnostics:  2018 Spirometry: FVC is 4.05 L and FEV1 is 3.09 L with an FEV1 ratio of 94%.  No significant postbronchodilator improvement. Patient asymptomatic. 2018 Epicutaneous testing:  Negative despite a positive histamine control. 2018 Intradermal testing: Positive to molds, dog epithelia, cockroach antigen, and dust mite antigen.  Other allergy screening: Medication allergy:  yes-amoxicillin and septra, not discussed in detail today Eczema:no  Past Medical History: Past Medical History:  Diagnosis Date   Allergic rhinitis    Alopecia    Anxiety    Asthma    Back pain    Depression    Dyspnea on exertion    GERD (gastroesophageal reflux disease)    Hypothyroidism    Lactose intolerance    Obesity    PCOS (polycystic ovarian syndrome)    Medication List:  Current Outpatient Medications  Medication Sig Dispense Refill   albuterol (PROVENTIL HFA;VENTOLIN HFA) 108 (90 Base) MCG/ACT inhaler Inhale into the lungs.     B Complex Vitamins (B COMPLEX 100 PO) Take 1 tablet by mouth daily.     Biotin 1000 MCG tablet Take by mouth.     busPIRone (BUSPAR) 5 MG tablet Take 5  mg by mouth daily as needed.     Cholecalciferol (VITAMIN D3) 5000 units CAPS Take 1 capsule by mouth daily.     escitalopram (LEXAPRO) 20 MG tablet Take 20 mg by mouth daily.     levocetirizine (XYZAL) 5 MG tablet Take 5 mg by mouth every evening.     Magnesium 500 MG CAPS Take 1 capsule by mouth daily.     montelukast (SINGULAIR) 10 MG tablet Take 10  mg by mouth at bedtime.     Multiple Vitamin (MULTI-VITAMINS) TABS Take by mouth.     progesterone (PROMETRIUM) 200 MG capsule Take 200 mg by mouth daily.     spironolactone (ALDACTONE) 100 MG tablet Take 100 mg by mouth 2 (two) times daily.     TURMERIC CURCUMIN PO Take 400 mg by mouth daily.     No current facility-administered medications for this visit.   Known Allergies:  Allergies  Allergen Reactions   Amoxicillin Rash   Septra [Sulfamethoxazole-Trimethoprim] Rash   Past Surgical History: History reviewed. No pertinent surgical history. Family History: Family History  Problem Relation Age of Onset   Hypertension Mother    Thyroid disease Mother    Sleep apnea Mother    Eating disorder Mother    Obesity Mother    Hypertension Father    Depression Father    Anxiety disorder Father    Alcoholism Father    Obesity Father    Social History: Tara Prince lives in a townhouse built 15 years ago without water damage, vinyl plank floors, gas heating, central AC with window unit, pet dog, no visible pests, no dust mite protection on bedding or pillows, some smoke exposure.  She works as a Land x20 years.  + HEPA filter in home.   ROS:  All other systems negative except as noted per HPI.  Objective:  Blood pressure 122/80, pulse 86, temperature (!) 97.5 F (36.4 C), temperature source Temporal, resp. rate 18, height 5' 5.5" (1.664 m), weight 209 lb 9.6 oz (95.1 kg), SpO2 98 %. Body mass index is 34.35 kg/m. Physical Exam:  General Appearance:  Alert, cooperative, no distress, appears stated age  Head:  Normocephalic, without obvious abnormality, atraumatic  Eyes:  Conjunctiva clear, EOM's intact  Nose: Nares normal, hypertrophic bilateral turbinates, no visible polyps  Throat: Lips, tongue normal; teeth and gums normal, + mild cobblestoning posterior oropharynx otherwise normal  Neck: Supple, symmetrical  Lungs:   Clear to auscultation bilaterally, respirations  unlabored, no coughing  Heart:  Regular rate and rhythm, no murmur, appears well perfused  Extremities: No edema  Skin: Skin color, texture, turgor normal, small linear rash on right lower extremity  Neurologic: No gross deficits   Diagnostics: Spirometry:  Tracings reviewed. Her effort: Good reproducible efforts. FVC: 3.40L FEV1: 2.77L, 92% predicted FEV1/FVC ratio: 100% Interpretation: Spirometry consistent with normal pattern.  Please see scanned spirometry results for details.  Skin Testing: Environmental allergy panel and select foods. Adequate positive and negative controls. Results discussed with patient/family.  Airborne Adult Perc - 11/02/21 0943     Time Antigen Placed 0944    Allergen Manufacturer Lavella Hammock    Location Back    Number of Test 59    Panel 1 Select    1. Control-Buffer 50% Glycerol Negative    2. Control-Histamine 1 mg/ml 3+    3. Albumin saline Negative    4. [place] Negative    5. [place] Negative    6. Johnson Negative    7. [facility]  Blue Negative    8. Meadow Fescue Negative    9. Perennial Rye Negative    10. Sweet Vernal Negative    11. Timothy Negative    12. Cocklebur Negative    13. Burweed Marshelder Negative    14. Ragweed, short Negative    15. Ragweed, Giant Negative    16. Plantain,  English Negative    17. Lamb's Quarters Negative    18. Sheep Sorrell Negative    19. Rough Pigweed Negative    20. Marsh Elder, Rough Negative    21. Mugwort, Common Negative    22. Ash mix Negative    23. Birch mix Negative    24. Beech American Negative    25. Box, Elder Negative    26. Cedar, red Negative    27. Cottonwood, [place] Negative    28. Elm mix Negative    29. Hickory Negative    30. Maple mix Negative    31. Oak, [place] mix Negative    32. Pecan Pollen Negative    33. Pine mix Negative    34. Sycamore Eastern Negative    35. [place], Black Pollen Negative    36. Alternaria alternata Negative    37. Cladosporium Herbarum Negative     38. Aspergillus mix Negative    39. Penicillium mix Negative    40. Bipolaris sorokiniana (Helminthosporium) Negative    41. Drechslera spicifera (Curvularia) Negative    42. Mucor plumbeus Negative    43. Fusarium moniliforme Negative    44. Aureobasidium pullulans (pullulara) Negative    45. Rhizopus oryzae Negative    46. Botrytis cinera Negative    47. Epicoccum nigrum Negative    48. Phoma betae Negative    49. Candida Albicans Negative    50. Trichophyton mentagrophytes Negative    51. Mite, D Farinae  5,000 AU/ml 2+    52. Mite, D Pteronyssinus  5,000 AU/ml 2+    53. Cat Hair 10,000 BAU/ml Negative    54.  Dog Epithelia Negative    55. Mixed Feathers 2+    56. Horse Epithelia Negative    57. Cockroach, German 2+    58. Mouse Negative    59. Tobacco Leaf Negative             Food Perc - 11/02/21 0944       Test Information   Time Antigen Placed [id number]    Allergen Manufacturer Lavella Hammock    Location Back    Number of allergen test 10    Food Select      Food   1. Peanut Omitted    2. Soybean food Omitted    3. Wheat, whole Omitted    4. Sesame Omitted    5. Milk, cow Omitted    6. Egg White, chicken Omitted    7. Casein Omitted    8. Shellfish mix Omitted    9. Fish mix Omitted    10. Cashew Omitted             Intradermal - 11/02/21 1029     Time Antigen Placed 1029    Allergen Manufacturer Other    Location Arm    Number of Test 13    Control Negative    [place] 3+    Johnson 3+    7 Grass Negative    Ragweed mix 3+    Weed mix 3+    Tree mix 3+    Mold 1 Negative    Mold 2  3+    Mold 3 Negative    Mold 4 Negative    Cat Negative    Dog 3+             Food Adult Perc - 11/02/21 0900     Time Antigen Placed [id number]    Allergen Manufacturer Lavella Hammock    Location Back    Number of allergen test 6    8. Shellfish Mix Negative    25. Shrimp Negative    26. Crab Negative    27. Lobster Negative    28. Oyster Negative    29. Scallops  Negative             Allergy testing results were read and interpreted by myself, documented by clinical staff.  Assessment and Plan   Patient Instructions  Chronic Rhinitis: seasonal and perennial-uncontrolled: - allergy testing today was positive to grasses, weeds, ragweed, trees, molds, dust mites, dog, cockroach and mixed feathers - allergen avoidance as below -We will start allergy shots, consent signed and epi sent - Start Nasal Steroid Spray: Options include Flonase (fluticasone), Nasocort (triamcinolone), Nasonex (mometasome) 1- 2 sprays in each nostril daily (can buy over-the-counter if not covered by insurance)  Best results if used daily. - Start Atrovent (Ipratropium Bromide) 1-2 sprays in each nostril up to 3 times a day as needed for runny nose/post nasal drip/drainage.  Use less frequently if airway gets too dry. - Continue Singulair (Montelukast) 10mg  daily  - Continue over the counter antihistamine daily or daily as needed.   -Your options include Zyrtec (Cetirizine) 10mg , Claritin (Loratadine) 10mg , Allegra (Fexofenadine) 180mg , or Xyzal (Levocetirinze) 5mg   Exercise Induced Asthma + VCD overlap: - your lung testing today looked good - Use Albuterol (Proair/Ventolin) 2 puffs every 4-6 hours as needed for chest tightness, wheezing, or coughing and 2 puffs 15 minutes prior to exercise if you have symptoms with activity - Use a spacer with all inhalers - Asthma is not controlled if:  - Symptoms are occurring >2 times a week OR  - >2 times a month nighttime awakenings  - Please call the clinic to schedule a follow up if these symptoms arise -For symptoms occurring with strong smells or chemicals or that occur abruptly and feel like trouble breathing in, use vocal cord exercises as below.  If no improvement in 5 minutes use albuterol inhaler. -Discussed referral to speech therapy if the above exercises are not enough to control symptoms  Food allergy versus other  adverse food reaction:  - today's skin testing was negative to shellfish - please strictly avoid calamari (squid) and octopus (okay to continue eating the other shellfish you are already tolerating) - will test for squid and octupus in blood and if negative, can do oral challenge - for SKIN only reaction, okay to take Benadryl 2 capsules every 4 hours - for SKIN + ANY additional symptoms, OR IF concern for LIFE THREATENING reaction = Epipen Autoinjector EpiPen 0.3 mg. - If using Epinephrine autoinjector, call 911 - A food allergy action plan has been provided and discussed. - Medic Alert identification is recommended.  History of reaction to penicillin-not addressed today - Needs to be addressed at follow-up visit.  Follow-up in 3 months.   This note in its entirety was forwarded to the Provider who requested this consultation.  Thank you for your kind referral. I appreciate the opportunity to take part in [place] care. Please do not hesitate to contact me with questions.  Sincerely,  Sigurd Sos,  MD [facility]

## 2021-11-05 NOTE — Progress Notes (Signed)
Aeroallergen Immunotherapy   Ordering Provider: Dr. Tonny Bollman   Patient Details  Name: Tara Prince  MRN: [id number]  Date of Birth: January 11, 1976   Order 1 of 2   Vial Label: G-W-T-Dog   0.3 ml (Volume)  BAU Concentration -- [place] 10,000  0.2 ml (Volume)  1:20 Concentration -- Johnson  0.3 ml (Volume)  1:20 Concentration -- Ragweed Mix  0.5 ml (Volume)  1:20 Concentration -- Weed Mix*  0.5 ml (Volume)  1:20 Concentration -- Eastern 10 Tree Mix (also Sweet Gum)  0.5 ml (Volume)  1:10 Concentration -- Dog Epithelia    2.3  ml Extract Subtotal  2.7  ml Diluent  5.0  ml Maintenance Total   Schedule:  B  Blue Vial (1:100,000): Schedule B (6 doses)  Yellow Vial (1:10,000): Schedule B (6 doses)  Green Vial (1:1,000): Schedule B (6 doses)  Red Vial (1:100): Schedule A (10 doses)   Special Instructions: normal protocol

## 2021-11-05 NOTE — Progress Notes (Signed)
Aeroallergen Immunotherapy   Ordering Provider: Dr. Tonny Bollman   Patient Details  Name: Tara Prince  MRN: [id number]  Date of Birth: Apr 30, 1976   Order 2 of 2   Vial Label: DM-CR-Mold   0.2 ml (Volume)  1:10 Concentration -- Aspergillus mix  0.2 ml (Volume)  1:10 Concentration -- Penicillium mix  0.3 ml (Volume)  1:20 Concentration -- Cockroach, German  0.5 ml (Volume)   AU Concentration -- Mite Mix (DF 5,000 & DP 5,000)    1.2  ml Extract Subtotal  3.8  ml Diluent  5.0  ml Maintenance Total   Schedule:  B  Blue Vial (1:100,000): Schedule B (6 doses)  Yellow Vial (1:10,000): Schedule B (6 doses)  Green Vial (1:1,000): Schedule B (6 doses)  Red Vial (1:100): Schedule A (10 doses)   Special Instructions: normal protocol

## 2021-11-07 DIAGNOSIS — J3081 Allergic rhinitis due to animal (cat) (dog) hair and dander: Secondary | ICD-10-CM | POA: Diagnosis not present

## 2021-11-07 NOTE — Progress Notes (Signed)
VIALS MADE. EXP 11-07-22

## 2021-11-08 DIAGNOSIS — J3089 Other allergic rhinitis: Secondary | ICD-10-CM | POA: Diagnosis not present

## 2021-11-30 ENCOUNTER — Ambulatory Visit (INDEPENDENT_AMBULATORY_CARE_PROVIDER_SITE_OTHER): Payer: BC Managed Care – PPO

## 2021-11-30 ENCOUNTER — Other Ambulatory Visit: Payer: Self-pay

## 2021-11-30 DIAGNOSIS — J309 Allergic rhinitis, unspecified: Secondary | ICD-10-CM | POA: Diagnosis not present

## 2021-11-30 NOTE — Progress Notes (Signed)
Immunotherapy   Patient Details  Name: Tara Prince MRN: [id number] Date of Birth: 1976/04/29  11/30/2021  Tara Prince  pt here to start shots blue vial Following schedule: B  Frequency:WEEKLY Epi-Pen:EPIPEN WITH PT Consent signed and patient instructions given.   Berna Bue 11/30/2021, 8:47 AM

## 2021-12-06 ENCOUNTER — Telehealth: Payer: Self-pay

## 2021-12-06 ENCOUNTER — Other Ambulatory Visit: Payer: Self-pay | Admitting: Internal Medicine

## 2021-12-06 MED ORDER — PAXLOVID (300/100) 20 X 150 MG & 10 X 100MG PO TBPK: ORAL_TABLET | ORAL | 0 refills | Status: DC

## 2021-12-06 NOTE — Telephone Encounter (Signed)
Called patient back she has been having symptoms since yesterday and took 2 test today both were positive. She would like the medication sent to [id number] in [place]. I called the pharmacy and they have it in stock.

## 2021-12-06 NOTE — Progress Notes (Signed)
Encounter created for Paxlovid treatment.

## 2021-12-06 NOTE — Telephone Encounter (Signed)
Patient planned to come into the [place] office today for an allergy shot. She has a sore throat stuffy nose and congestion. Has been testing for covid for a couple of days. She came up positive today. She called to let us know she is Covid positive. Since she is a new start it will not really affect her much. She will call back once she is covid clear. She has not seen a doctor for medication. Is it possible to send in Paxlovid for the patient to help with her symptoms.

## 2021-12-06 NOTE — Telephone Encounter (Signed)
Called the patient she verbalized understanding mask/ isolation protocol and said thank you for sending in the medication.

## 2021-12-06 NOTE — Telephone Encounter (Signed)
Thank you. I have sent in a prescription for her to take for 5 days. Please have her stay home and isolate for those 5 days and to mask for an additional 5 days.

## 2021-12-20 ENCOUNTER — Other Ambulatory Visit: Payer: Self-pay

## 2021-12-20 ENCOUNTER — Ambulatory Visit (INDEPENDENT_AMBULATORY_CARE_PROVIDER_SITE_OTHER): Payer: BC Managed Care – PPO

## 2021-12-20 DIAGNOSIS — J309 Allergic rhinitis, unspecified: Secondary | ICD-10-CM

## 2021-12-20 NOTE — Telephone Encounter (Signed)
error 

## 2021-12-26 ENCOUNTER — Ambulatory Visit (INDEPENDENT_AMBULATORY_CARE_PROVIDER_SITE_OTHER): Payer: BC Managed Care – PPO

## 2021-12-26 DIAGNOSIS — J309 Allergic rhinitis, unspecified: Secondary | ICD-10-CM

## 2022-01-04 ENCOUNTER — Ambulatory Visit (INDEPENDENT_AMBULATORY_CARE_PROVIDER_SITE_OTHER): Payer: BC Managed Care – PPO

## 2022-01-04 DIAGNOSIS — J309 Allergic rhinitis, unspecified: Secondary | ICD-10-CM | POA: Diagnosis not present

## 2022-01-11 ENCOUNTER — Ambulatory Visit (INDEPENDENT_AMBULATORY_CARE_PROVIDER_SITE_OTHER): Payer: BC Managed Care – PPO

## 2022-01-11 DIAGNOSIS — J309 Allergic rhinitis, unspecified: Secondary | ICD-10-CM | POA: Diagnosis not present

## 2022-01-25 ENCOUNTER — Ambulatory Visit (INDEPENDENT_AMBULATORY_CARE_PROVIDER_SITE_OTHER): Payer: BC Managed Care – PPO | Admitting: *Deleted

## 2022-01-25 DIAGNOSIS — J309 Allergic rhinitis, unspecified: Secondary | ICD-10-CM | POA: Diagnosis not present

## 2022-02-01 ENCOUNTER — Ambulatory Visit (INDEPENDENT_AMBULATORY_CARE_PROVIDER_SITE_OTHER): Payer: BC Managed Care – PPO

## 2022-02-01 DIAGNOSIS — J309 Allergic rhinitis, unspecified: Secondary | ICD-10-CM

## 2022-02-01 NOTE — Progress Notes (Signed)
FOLLOW UP Date of Service/Encounter:  02/04/22   Subjective:  Tara Prince (DOB: 1976-10-09) is a 46 y.o. female who [facility] on 02/04/2022 in re-evaluation of the following: allergic rhinitis, exercise induced asthma, VCD overlap, concern for shellfish allergy (pending labs and potential oral challenge for confirmation) History obtained from: chart review and patient.  For Review, LV was on 11/02/2021 with Dr.Jillien Yakel.  We discussed starting allergy shots.  Also started a nasal steroid spray, Atrovent nasal spray and continued Singulair and antihistamine.  We discussed diagnosis of exercise-induced asthma plus VCD overlap based on her symptoms of abrupt feeling of difficulty breathing and with strong smells and chemicals.  Pertinent History/Diagnostics:  - Asthma: Exercise-induced with VCD overlap  -Normal spirometry (11/02/2021): ratio 100 %, 2.77 L FEV1   -2018 Spirometry: FVC is 4.05 L and FEV1 is 3.09 L with an FEV1 ratio of 94%.  No significant postbronchodilator improvement. Patient asymptomatic. - Allergic Rhinitis: Started allergy shots on 11/30/2021  - SPT environmental panel (11/02/2021): Borderline to dust mite, mixed feathers, cockroach  -Intradermal testing (11/02/2021): Positive to [place], [place], ragweed mix, weed mix, tree mix, mold 2, dog  -2018 Epicutaneous testing:  Negative despite a positive histamine control. -2018 Intradermal testing: Positive to molds, dog epithelia, cockroach antigen, and dust mite antigen. - Food Allergy (shellfish)  - Hx of reaction: Calamari, mussels cause her to have extreme itching without other symptoms, tolerates crustaceans and oysters.  Tolerates finned fish.   Has lactose intolerance  - SPT select foods (11/02/2021): Shellfish panel negative  -Labs ordered for squid and Octopus but not obtained  Today presents for follow-up. She is tolerating her allergy shots well, but notes no significant difference  since starting. She had COVID for the first time in December. Mostly head-cold symptoms.  She has had a lot of drainage which has improved some, but continues to be an issue.  She stays so congested that she feels that the medications drip right out of her nose. She does use the Netti saline rinse first prior to medicated sprays, but doesn't feel it gets in her sinus really well.  She has never had any images of her sinuses.  She also has a lot of crackling and popping of her ears.  Using the Netti pots does causes some pain and she can feel the liquid going into her ears.  She has always had issues equalizing her ears.  Her nose runs like a faucet all day. She always feels more congested on her right side for some reason. She hasn't needed the albuterol often since last visit.  She tried the VCD exercises but didn't feel they were helpful.  She has not been as active since last visit.    Allergies as of 02/04/2022       Reactions   Topiramate Other (See Comments)   headache headache   Amoxicillin Rash   Septra [sulfamethoxazole-trimethoprim] Rash        Medication List        Accurate as of February 04, 2022  5:40 PM. If you have any questions, ask your nurse or doctor.          albuterol 108 (90 Base) MCG/ACT inhaler Commonly known as: VENTOLIN HFA Inhale into the lungs.   B COMPLEX 100 PO Take 1 tablet by mouth daily.   Biotin 1000 MCG tablet Take by mouth.   busPIRone 5 MG tablet Commonly known as: BUSPAR Take 5 mg by mouth daily  as needed.   escitalopram 20 MG tablet Commonly known as: LEXAPRO Take 20 mg by mouth daily.   ipratropium 0.03 % nasal spray Commonly known as: ATROVENT Place 2 sprays into both nostrils 3 (three) times daily as needed for rhinitis.   levocetirizine 5 MG tablet Commonly known as: XYZAL Take 5 mg by mouth every evening.   Magnesium 500 MG Caps Take 1 capsule by mouth daily.   montelukast 10 MG tablet Commonly known as:  SINGULAIR Take 10 mg by mouth at bedtime.   Multi-Vitamins Tabs Take by mouth.   Paxlovid (300/100) 20 x 150 MG & 10 x 100MG  Tbpk Generic drug: nirmatrelvir & ritonavir administer all three tablets together orally twice daily with or without food for 5 day   progesterone 200 MG capsule Commonly known as: PROMETRIUM Take 200 mg by mouth daily.   spironolactone 100 MG tablet Commonly known as: ALDACTONE Take 100 mg by mouth 2 (two) times daily.   TURMERIC CURCUMIN PO Take 400 mg by mouth daily.   Vitamin D3 125 MCG (5000 UT) Caps Take 1 capsule by mouth daily.       Past Medical History:  Diagnosis Date   Allergic rhinitis    Alopecia    Anxiety    Asthma    Back pain    Depression    Dyspnea on exertion    GERD (gastroesophageal reflux disease)    Hypothyroidism    Lactose intolerance    Obesity    PCOS (polycystic ovarian syndrome)    History reviewed. No pertinent surgical history. Otherwise, there have been no changes to her past medical history, surgical history, family history, or social history.  ROS: All others negative except as noted per HPI.   Objective:  BP 118/74    Pulse 75    Temp (!) 97.3 F (36.3 C) (Temporal)    SpO2 97%  There is no height or weight on file to calculate BMI. Physical Exam: General Appearance:  Alert, cooperative, no distress, appears stated age  Head:  Normocephalic, without obvious abnormality, atraumatic  Eyes:  Conjunctiva clear, EOM's intact  Nose: Nares normal,  hypertrophic turbinates (R>>>L), no visible anterior polyps  Throat: Lips, tongue normal; teeth and gums normal, + cobblestoning  Neck: Supple, symmetrical  Lungs:   clear to auscultation bilaterally, Respirations unlabored, no coughing  Heart:  regular rate and rhythm and no murmur, Appears well perfused  Extremities: No edema  Skin: Skin color, texture, turgor normal, no rashes or lesions on visualized portions of skin  Neurologic: No gross deficits    Assessment/Plan   Patient Instructions  Chronic Rhinitis: seasonal and perennial: -- allergen avoidance directed at grasses, weeds, ragweed, trees, molds, dust mites, dog, cockroach and mixed feathers - continua allergy shots - Continue Nasal Steroid Spray: Options include Flonase (fluticasone), Nasocort (triamcinolone), Nasonex (mometasome) 1- 2 sprays in each nostril daily (can buy over-the-counter if not covered by insurance)  Best results if used daily. - Continue Atrovent (Ipratropium Bromide) 1-2 sprays in each nostril up to 3 times a day as needed for runny nose/post nasal drip/drainage.  Use less frequently if airway gets too dry. - Continue Singulair (Montelukast) 10mg  daily  - Continue over the counter antihistamine daily or daily as needed.   -Your options include Zyrtec (Cetirizine) 10mg , Claritin (Loratadine) 10mg , Allegra (Fexofenadine) 180mg , or Xyzal (Levocetirinze) 5mg  - stop Sinus Rinses, use saline nasal spray - CT sinus  Exercise Induced Asthma: - Use Albuterol (Proair/Ventolin) 2 puffs every 4-6  hours as needed for chest tightness, wheezing, or coughing and 2 puffs 15 minutes prior to exercise if you have symptoms with activity - Use a spacer with all inhalers - Asthma is not controlled if:  - Symptoms are occurring >2 times a week OR  - >2 times a month nighttime awakenings  - Please call the clinic to schedule a follow up if these symptoms arise -For symptoms occurring with strong smells or chemicals or that occur abruptly and feel like trouble breathing in, use vocal cord exercises as below.  If no improvement in 5 minutes use albuterol inhaler.  Food allergy:  - skin testing was negative to shellfish - please strictly avoid calamari (squid) and octopus (okay to continue eating the other shellfish you are already tolerating) - consider acquiring testing for squid and octupus in blood and if negative, can do oral challenge - for SKIN only reaction, okay to take  Benadryl 2 capsules every 4 hours - for SKIN + ANY additional symptoms, OR IF concern for LIFE THREATENING reaction = Epipen Autoinjector EpiPen 0.3 mg. - If using Epinephrine autoinjector, call 911 - A food allergy action plan has been provided and discussed. - Medic Alert identification is recommended.  Follow-up in 3 months, sooner if needed.  Will call once CT sinus scheduled.  Sigurd Sos, MD  [facility]

## 2022-02-04 ENCOUNTER — Other Ambulatory Visit: Payer: Self-pay

## 2022-02-04 ENCOUNTER — Encounter: Payer: Self-pay | Admitting: Internal Medicine

## 2022-02-04 ENCOUNTER — Ambulatory Visit (INDEPENDENT_AMBULATORY_CARE_PROVIDER_SITE_OTHER): Payer: BC Managed Care – PPO | Admitting: Internal Medicine

## 2022-02-04 VITALS — BP 118/74 | HR 75 | Temp 97.3°F

## 2022-02-04 DIAGNOSIS — J45990 Exercise induced bronchospasm: Secondary | ICD-10-CM

## 2022-02-04 DIAGNOSIS — J383 Other diseases of vocal cords: Secondary | ICD-10-CM | POA: Diagnosis not present

## 2022-02-04 DIAGNOSIS — J3089 Other allergic rhinitis: Secondary | ICD-10-CM | POA: Diagnosis not present

## 2022-02-04 DIAGNOSIS — J309 Allergic rhinitis, unspecified: Secondary | ICD-10-CM | POA: Diagnosis not present

## 2022-02-04 DIAGNOSIS — J329 Chronic sinusitis, unspecified: Secondary | ICD-10-CM

## 2022-02-04 DIAGNOSIS — T7800XA Anaphylactic reaction due to unspecified food, initial encounter: Secondary | ICD-10-CM

## 2022-02-04 NOTE — Patient Instructions (Addendum)
Chronic Rhinitis: seasonal and perennial: uncontrolled -- allergen avoidance directed at grasses, weeds, ragweed, trees, molds, dust mites, dog, cockroach and mixed feathers - continue allergy shots per protocol, still too early to detect any benefit - Continue Nasal Steroid Spray: Options include Flonase (fluticasone), Nasocort (triamcinolone), Nasonex (mometasome) 1- 2 sprays in each nostril daily (can buy over-the-counter if not covered by insurance)  Best results if used daily. - Continue Atrovent (Ipratropium Bromide) 1-2 sprays in each nostril up to 3 times a day as needed for runny nose/post nasal drip/drainage.  Use less frequently if airway gets too dry. - Continue Singulair (Montelukast) 10mg  daily  - Continue over the counter antihistamine daily or daily as needed.   -Your options include Zyrtec (Cetirizine) 10mg , Claritin (Loratadine) 10mg , Allegra (Fexofenadine) 180mg , or Xyzal (Levocetirinze) 5mg  - stop Sinus Rinses, use saline nasal spray - CT sinus to look for anatomical abnormalities  Exercise Induced Asthma: - Use Albuterol (Proair/Ventolin) 2 puffs every 4-6 hours as needed for chest tightness, wheezing, or coughing and 2 puffs 15 minutes prior to exercise if you have symptoms with activity - Use a spacer with all inhalers - Asthma is not controlled if:  - Symptoms are occurring >2 times a week OR  - >2 times a month nighttime awakenings  - Please call the clinic to schedule a follow up if these symptoms arise -For symptoms occurring with strong smells or chemicals or that occur abruptly and feel like trouble breathing in, use vocal cord exercises as below.  If no improvement in 5 minutes use albuterol inhaler.  Food allergy:  - skin testing was negative to shellfish - please strictly avoid calamari (squid) and octopus (okay to continue eating the other shellfish you are already tolerating) - consider acquiring testing for squid and octupus in blood and if negative, can do  oral challenge - for SKIN only reaction, okay to take Benadryl 2 capsules every 4 hours - for SKIN + ANY additional symptoms, OR IF concern for LIFE THREATENING reaction = Epipen Autoinjector EpiPen 0.3 mg. - If using Epinephrine autoinjector, call 911 - A food allergy action plan has been provided and discussed. - Medic Alert identification is recommended.  Follow-up in 3 months, sooner if needed.  Will call once CT sinus scheduled.

## 2022-02-05 ENCOUNTER — Telehealth: Payer: Self-pay | Admitting: *Deleted

## 2022-02-05 NOTE — Telephone Encounter (Signed)
Pre Cert initiated for sinus CT without contrast. Faxed notes to [phone]. Reference #[id number].

## 2022-02-06 NOTE — Telephone Encounter (Signed)
Pre cert has been approved  

## 2022-02-06 NOTE — Telephone Encounter (Signed)
Scheduled appt for tomorrow 2/9 @ 7:30 arrival time no special instructions. Located at [facility] [address] [place],  [place]  [id number]  Called pt to relay info of the approval and that [facility] is out of network. She said she has had that happened before and it would require her to pay 80%.   [facility] network [facility] but per pt the [facility] is in network. Their NPI # is [id number].   Called insurance back to let them know about facility change. Reference number for [id number].   Auth # for [id number].   Appt canceled at [facility] and scheduled at [facility] for 2/28 at 9:00 am. Pt aware.

## 2022-02-06 NOTE — Addendum Note (Signed)
Addended by: Maryjean Morn D on: 02/06/2022 12:39 PM   Modules accepted: Orders

## 2022-02-07 ENCOUNTER — Ambulatory Visit (HOSPITAL_BASED_OUTPATIENT_CLINIC_OR_DEPARTMENT_OTHER): Payer: BC Managed Care – PPO

## 2022-02-26 ENCOUNTER — Ambulatory Visit
Admission: RE | Admit: 2022-02-26 | Discharge: 2022-02-26 | Disposition: A | Discharge status: Home / Self Care | Payer: BC Managed Care – PPO | Source: Ambulatory Visit | Attending: Internal Medicine | Admitting: Internal Medicine

## 2022-02-27 ENCOUNTER — Telehealth: Payer: Self-pay

## 2022-02-27 NOTE — Progress Notes (Signed)
Besides rightward septal deviation (which would explain her symptoms being worse on the right), her CT sinuses overall look good.  I would continue with her allergy injections at this time.  If she would like to speak with an ear nose and throat physician regarding nasal septal surgery, we can make that referral for her.

## 2022-02-27 NOTE — Telephone Encounter (Signed)
Please refer to ENT for right side deviation per Dr Simona Huh ?

## 2022-03-04 ENCOUNTER — Ambulatory Visit (INDEPENDENT_AMBULATORY_CARE_PROVIDER_SITE_OTHER): Payer: BC Managed Care – PPO

## 2022-03-04 DIAGNOSIS — J309 Allergic rhinitis, unspecified: Secondary | ICD-10-CM | POA: Diagnosis not present

## 2022-03-07 NOTE — Telephone Encounter (Signed)
Thank you :)

## 2022-03-07 NOTE — Telephone Encounter (Signed)
Patient is scheduled for March 15 @ 3 Dr. Richardson Landry. Mychart message sent to patient.  ? ?

## 2022-03-13 ENCOUNTER — Ambulatory Visit (INDEPENDENT_AMBULATORY_CARE_PROVIDER_SITE_OTHER): Payer: BC Managed Care – PPO

## 2022-03-13 DIAGNOSIS — J309 Allergic rhinitis, unspecified: Secondary | ICD-10-CM

## 2022-03-29 ENCOUNTER — Ambulatory Visit (INDEPENDENT_AMBULATORY_CARE_PROVIDER_SITE_OTHER): Payer: BC Managed Care – PPO

## 2022-03-29 DIAGNOSIS — J309 Allergic rhinitis, unspecified: Secondary | ICD-10-CM

## 2022-04-01 ENCOUNTER — Ambulatory Visit (INDEPENDENT_AMBULATORY_CARE_PROVIDER_SITE_OTHER): Payer: BC Managed Care – PPO

## 2022-04-01 DIAGNOSIS — J309 Allergic rhinitis, unspecified: Secondary | ICD-10-CM | POA: Diagnosis not present

## 2022-04-12 ENCOUNTER — Ambulatory Visit (INDEPENDENT_AMBULATORY_CARE_PROVIDER_SITE_OTHER): Payer: BC Managed Care – PPO | Admitting: *Deleted

## 2022-04-12 DIAGNOSIS — J309 Allergic rhinitis, unspecified: Secondary | ICD-10-CM | POA: Diagnosis not present

## 2022-04-18 ENCOUNTER — Ambulatory Visit (INDEPENDENT_AMBULATORY_CARE_PROVIDER_SITE_OTHER): Payer: BC Managed Care – PPO

## 2022-04-18 DIAGNOSIS — J309 Allergic rhinitis, unspecified: Secondary | ICD-10-CM | POA: Diagnosis not present

## 2022-04-22 ENCOUNTER — Ambulatory Visit (INDEPENDENT_AMBULATORY_CARE_PROVIDER_SITE_OTHER): Payer: BC Managed Care – PPO

## 2022-04-22 DIAGNOSIS — J309 Allergic rhinitis, unspecified: Secondary | ICD-10-CM | POA: Diagnosis not present

## 2022-05-01 ENCOUNTER — Ambulatory Visit (INDEPENDENT_AMBULATORY_CARE_PROVIDER_SITE_OTHER): Payer: BC Managed Care – PPO

## 2022-05-01 DIAGNOSIS — J309 Allergic rhinitis, unspecified: Secondary | ICD-10-CM | POA: Diagnosis not present

## 2022-05-03 NOTE — Progress Notes (Signed)
? ?FOLLOW UP ?Date of Service/Encounter:  05/06/22 ? ? ?Subjective:  ?Tara Prince (DOB: September 11, 1976) is a 46 y.o. female [facility] in re-evaluation of the following: alallergic rhinitis, exercise induced asthma, VCD overlap, concern for shellfish allergylergic rhinitis, exercise induced asthma, VCD overlap?, concern for shellfish allergy ?History obtained from: chart review and patient. ? ?For Review, LV was on 02/04/22  with Dr.Eulla Kochanowski seen for regular follow-up.  Reported significant ongoing PND, little improvement since starting AIT. Feeling more right sided congestion. ?Asthma was well-controlled. She did not find VCD exercises helpful. ?We ordered a CT sinus as she had never had imaging.  ? ?Pertinent History/Diagnostics:  ?- Asthma: Exercise-induced with VCD overlap? (Didn't find VCD exercises helpful)                ?-Normal spirometry (11/02/2021): ratio 100 %, 2.77 L FEV1  ?               -2018 Spirometry: FVC is 4.05 L and FEV1 is 3.09 L with an FEV1 ratio of 94%.  No significant postbronchodilator improvement. Patient asymptomatic. ?- Allergic Rhinitis: Started allergy shots on 11/30/2021 ?               - SPT environmental panel (11/02/2021): Borderline to dust mite, mixed feathers, cockroach ?               -Intradermal testing (11/02/2021): Positive to [place], [place], ragweed mix, weed mix, tree mix, mold 2, dog ?               -2018 Epicutaneous testing:  Negative despite a positive histamine control. ?-2018 Intradermal testing: Positive to molds, dog epithelia, cockroach antigen, and dust mite antigen. ?- Food Allergy (shellfish) ?               - Hx of reaction: Calamari, mussels cause her to have extreme itching without other symptoms, tolerates crustaceans and oysters.  Tolerates finned fish.   ?Has lactose intolerance ?               - SPT select foods (11/02/2021): Shellfish panel negative ?               -Labs ordered for squid and Octopus but  not obtained ? ?Today presents for follow-up. ?Since last visit, CT imaging revealed rightward deviated septum.  She was referred to ENT with noted moderate turbinates with scheduled turbinate reduction on 05/24/22.  She is looking forward to this procedure ?She is still having difficulty breathing out of her nose.  She suffers during exercise.   ?She continues to tolerate her allergy injetions well.  Not noticing any difference yet. ?She is using Xyzal and Singulair daily.   ?Nasal sprays are not really helping.  It feels like they still drip out. ?She is not using the albuterol recently. She feels she has gotten her stamina back from when she had COVID previously.  She will use albuterol now mostly when the temperature gets warmer.   ?She has not gone to obtain her testing for squid and octopus.  She has been avoiding both, but would like to eat squid again. ?Additionally, she notes that when she is bitten by mosquito she develops very large bumps which will at the bite site. ? ?   ? ?Allergies as of 05/06/2022   ? ?   Reactions  ? Topiramate Other (See Comments)  ? headache ?headache  ? Amoxicillin Rash  ? Septra [  sulfamethoxazole-trimethoprim] Rash  ? ?  ? ?  ?Medication List  ?  ? ?  ? Accurate as of May 06, 2022  5:56 PM. If you have any questions, ask your nurse or doctor.  ?  ?  ? ?  ? ?STOP taking these medications   ? ?magnesium gluconate 500 MG tablet ?Commonly known as: MAGONATE ?Stopped by: Tonny BollmanErin Sender Rueb, MD ?  ?Paxlovid (300/100) 20 x 150 MG & 10 x 100MG  Tbpk ?Generic drug: nirmatrelvir & ritonavir ?Stopped by: Tonny BollmanErin Deslyn Cavenaugh, MD ?  ? ?  ? ?TAKE these medications   ? ?albuterol 108 (90 Base) MCG/ACT inhaler ?Commonly known as: VENTOLIN HFA ?Inhale into the lungs. ?  ?B COMPLEX 100 PO ?Take 1 tablet by mouth daily. ?  ?Biotin 1000 MCG tablet ?Take by mouth. ?  ?Botox 200 units Solr ?Generic drug: Botulinum Toxin Type A ?SMARTSIG:200 Unit(s) IM ?  ?busPIRone 5 MG tablet ?Commonly known as: BUSPAR ?Take 5 mg by  mouth daily as needed. ?  ?co-enzyme Q-10 30 MG capsule ?Take by mouth. ?  ?diclofenac 50 MG tablet ?Commonly known as: CATAFLAM ?Take 1 tablet at onset of headache. May repeat in 1 hour if still present. ?  ?escitalopram 20 MG tablet ?Commonly known as: LEXAPRO ?Take 20 mg by mouth daily. ?  ?ipratropium 0.03 % nasal spray ?Commonly known as: ATROVENT ?Place 2 sprays into both nostrils 3 (three) times daily as needed for rhinitis. ?  ?levocetirizine 5 MG tablet ?Commonly known as: XYZAL ?Take 5 mg by mouth every evening. ?What changed: Another medication with the same name was removed. Continue taking this medication, and follow the directions you see here. ?Changed by: Tonny BollmanErin Thai Burgueno, MD ?  ?Magnesium 500 MG Caps ?Take 1 capsule by mouth daily. ?  ?montelukast 10 MG tablet ?Commonly known as: SINGULAIR ?Take 10 mg by mouth at bedtime. ?  ?Multi-Vitamins Tabs ?Take by mouth. ?  ?NP Thyroid 90 MG tablet ?Generic drug: thyroid ?Take 90 mg by mouth daily. ?  ?Nurtec 75 MG Tbdp ?Generic drug: Rimegepant Sulfate ?Take by mouth. ?  ?Ozempic (1 MG/DOSE) 4 MG/3ML Sopn ?Generic drug: Semaglutide (1 MG/DOSE) ?Inject into the skin. ?  ?progesterone 200 MG capsule ?Commonly known as: PROMETRIUM ?Take 200 mg by mouth daily. ?  ?Sodium Fluoride 5000 Sensitive 1.1-5 % Gel ?Generic drug: Sod Fluoride-Potassium Nitrate ?Take by mouth. ?  ?spironolactone 100 MG tablet ?Commonly known as: ALDACTONE ?Take 100 mg by mouth 2 (two) times daily. ?  ?tiZANidine 4 MG tablet ?Commonly known as: ZANAFLEX ?Take 4 mg by mouth every 6 (six) hours as needed. ?  ?TURMERIC CURCUMIN PO ?Take 400 mg by mouth daily. ?  ?Vitamin D3 125 MCG (5000 UT) Caps ?Take 1 capsule by mouth daily. ?  ? ?  ? ?Past Medical History:  ?Diagnosis Date  ? Allergic rhinitis   ? Alopecia   ? Anxiety   ? Asthma   ? Back pain   ? Depression   ? Dyspnea on exertion   ? GERD (gastroesophageal reflux disease)   ? Hypothyroidism   ? Lactose intolerance   ? Obesity   ? PCOS  (polycystic ovarian syndrome)   ? ?History reviewed. No pertinent surgical history. ?Otherwise, there have been no changes to her past medical history, surgical history, family history, or social history. ? ?ROS: All others negative except as noted per HPI.  ? ?Objective:  ?BP 124/80   Pulse 82   Temp 97.7 ?F (36.5 ?C) (Temporal)  Resp 18   SpO2 99%  ?There is no height or weight on file to calculate BMI. ?Physical Exam: ?General Appearance:  Alert, cooperative, no distress, appears stated age  ?Head:  Normocephalic, without obvious abnormality, atraumatic  ?Eyes:  Conjunctiva clear, EOM's intact  ?Nose: Nares normal, hypertrophic turbinates, normal mucosa, and no visible anterior polyps  ?Throat: Lips, tongue normal; teeth and gums normal, normal posterior oropharynx  ?Neck: Supple, symmetrical  ?Lungs:   clear to auscultation bilaterally, Respirations unlabored, no coughing  ?Heart:  regular rate and rhythm and no murmur, Appears well perfused  ?Extremities: No edema  ?Skin: Skin color, texture, turgor normal, no rashes or lesions on visualized portions of skin  ?Neurologic: No gross deficits  ? ? ?Assessment/Plan  ?Marylynne has not started to notice significant improvement from her allergy injections yet, but is not yet at maintenance which is to be expected.  She is planning on having turbinate reduction at the end of this month which I feel will be very effective for her based on her history and symptoms. ? ?Seasonal and perennial allergic rhinitis-partially controlled ?-- allergen avoidance directed at grasses, weeds, ragweed, trees, molds, dust mites, dog, cockroach and mixed feathers ?- continue allergy shots per protocol, still too early to detect any benefit ?- Continue Nasal Steroid Spray: Options include Flonase (fluticasone), Nasocort (triamcinolone), Nasonex (mometasome) 1- 2 sprays in each nostril daily (can buy over-the-counter if not covered by insurance)  Best results if used daily. ?- Continue  Atrovent (Ipratropium Bromide) 1-2 sprays in each nostril up to 3 times a day as needed for runny nose/post nasal drip/drainage.  Use less frequently if airway gets too dry. ?- Continue Singulair (Montelukast) 10

## 2022-05-06 ENCOUNTER — Ambulatory Visit (INDEPENDENT_AMBULATORY_CARE_PROVIDER_SITE_OTHER): Payer: BC Managed Care – PPO | Admitting: Internal Medicine

## 2022-05-06 ENCOUNTER — Encounter: Payer: Self-pay | Admitting: Internal Medicine

## 2022-05-06 VITALS — BP 124/80 | HR 82 | Temp 97.7°F | Resp 18

## 2022-05-06 DIAGNOSIS — J309 Allergic rhinitis, unspecified: Secondary | ICD-10-CM

## 2022-05-06 DIAGNOSIS — T781XXA Other adverse food reactions, not elsewhere classified, initial encounter: Secondary | ICD-10-CM

## 2022-05-06 DIAGNOSIS — W57XXXA Bitten or stung by nonvenomous insect and other nonvenomous arthropods, initial encounter: Secondary | ICD-10-CM

## 2022-05-06 DIAGNOSIS — J4520 Mild intermittent asthma, uncomplicated: Secondary | ICD-10-CM | POA: Diagnosis not present

## 2022-05-06 NOTE — Patient Instructions (Addendum)
Seasonal and perennial allergic rhinitis ?-- allergen avoidance directed at grasses, weeds, ragweed, trees, molds, dust mites, dog, cockroach and mixed feathers ?- continue allergy shots per protocol, still too early to detect any benefit ?- Continue Nasal Steroid Spray: Options include Flonase (fluticasone), Nasocort (triamcinolone), Nasonex (mometasome) 1- 2 sprays in each nostril daily (can buy over-the-counter if not covered by insurance)  Best results if used daily. ?- Continue Atrovent (Ipratropium Bromide) 1-2 sprays in each nostril up to 3 times a day as needed for runny nose/post nasal drip/drainage.  Use less frequently if airway gets too dry. ?- Continue Singulair (Montelukast) 10mg  daily  ?- Continue over the counter antihistamine daily or daily as needed.   ?-Your options include Zyrtec (Cetirizine) 10mg , Claritin (Loratadine) 10mg , Allegra (Fexofenadine) 180mg , or Xyzal (Levocetirinze) 5mg  ?- stop Sinus Rinses, use saline nasal spray ?-Continue to follow with ENT ? ? ?Exercise Induced Asthma: ?- Use Albuterol (Proair/Ventolin) 2 puffs every 4-6 hours as needed for chest tightness, wheezing, or coughing and 2 puffs 15 minutes prior to exercise if you have symptoms with activity ?- Use a spacer with all inhalers ?- Asthma is not controlled if: ? - Symptoms are occurring >2 times a week OR ? - >2 times a month nighttime awakenings ? - Please call the clinic to schedule a follow up if these symptoms arise ? ?Food allergy:  ?- skin testing was negative to shellfish ?- please strictly avoid calamari (squid) and octopus (okay to continue eating the other shellfish you are already tolerating) ?- consider acquiring testing for squid and octupus in blood and if negative, can do oral challenge ?- for SKIN only reaction, okay to take Benadryl 2 capsules every 4 hours ?- for SKIN + ANY additional symptoms, OR IF concern for LIFE THREATENING reaction = Epipen Autoinjector EpiPen 0.3 mg. ?- If using Epinephrine  autoinjector, call 911 ?- A food allergy action plan has been provided and discussed. ?- Medic Alert identification is recommended. ? ?Mosquito/insect bites ?Avoidance measures (DEET repellant for mosquitos, long clothing, etc) ?If bite occurs with raised rash:  ?- For itch: Topical steroid (hydrocortisone cream) twice daily as needed + oral antihistamine (zyrtec) ?- For pain and swelling: Oral anti-inflammatory (ibuprofen), ice affected area ? ?Follow-up in 6 months, sooner if needed.  ?It was so great seeing you again!  ?

## 2022-05-15 ENCOUNTER — Ambulatory Visit (INDEPENDENT_AMBULATORY_CARE_PROVIDER_SITE_OTHER): Payer: BC Managed Care – PPO

## 2022-05-15 DIAGNOSIS — J309 Allergic rhinitis, unspecified: Secondary | ICD-10-CM | POA: Diagnosis not present

## 2022-05-31 ENCOUNTER — Ambulatory Visit (INDEPENDENT_AMBULATORY_CARE_PROVIDER_SITE_OTHER): Payer: BC Managed Care – PPO

## 2022-05-31 DIAGNOSIS — J309 Allergic rhinitis, unspecified: Secondary | ICD-10-CM | POA: Diagnosis not present

## 2022-06-06 ENCOUNTER — Ambulatory Visit (INDEPENDENT_AMBULATORY_CARE_PROVIDER_SITE_OTHER): Payer: BC Managed Care – PPO

## 2022-06-06 DIAGNOSIS — J309 Allergic rhinitis, unspecified: Secondary | ICD-10-CM

## 2022-06-14 ENCOUNTER — Ambulatory Visit (INDEPENDENT_AMBULATORY_CARE_PROVIDER_SITE_OTHER): Payer: BC Managed Care – PPO

## 2022-06-14 DIAGNOSIS — J309 Allergic rhinitis, unspecified: Secondary | ICD-10-CM | POA: Diagnosis not present

## 2022-06-21 ENCOUNTER — Ambulatory Visit (INDEPENDENT_AMBULATORY_CARE_PROVIDER_SITE_OTHER): Payer: BC Managed Care – PPO | Admitting: *Deleted

## 2022-06-21 DIAGNOSIS — J309 Allergic rhinitis, unspecified: Secondary | ICD-10-CM

## 2022-06-28 ENCOUNTER — Ambulatory Visit (INDEPENDENT_AMBULATORY_CARE_PROVIDER_SITE_OTHER): Payer: BC Managed Care – PPO

## 2022-06-28 DIAGNOSIS — J309 Allergic rhinitis, unspecified: Secondary | ICD-10-CM

## 2022-07-04 ENCOUNTER — Ambulatory Visit (INDEPENDENT_AMBULATORY_CARE_PROVIDER_SITE_OTHER): Payer: BC Managed Care – PPO

## 2022-07-04 DIAGNOSIS — J309 Allergic rhinitis, unspecified: Secondary | ICD-10-CM | POA: Diagnosis not present

## 2022-07-05 ENCOUNTER — Ambulatory Visit: Payer: Self-pay

## 2022-07-12 ENCOUNTER — Ambulatory Visit (INDEPENDENT_AMBULATORY_CARE_PROVIDER_SITE_OTHER): Payer: BC Managed Care – PPO | Admitting: *Deleted

## 2022-07-12 DIAGNOSIS — J309 Allergic rhinitis, unspecified: Secondary | ICD-10-CM

## 2022-07-19 ENCOUNTER — Ambulatory Visit (INDEPENDENT_AMBULATORY_CARE_PROVIDER_SITE_OTHER): Payer: BC Managed Care – PPO | Admitting: *Deleted

## 2022-07-19 DIAGNOSIS — J309 Allergic rhinitis, unspecified: Secondary | ICD-10-CM

## 2022-07-26 ENCOUNTER — Ambulatory Visit (INDEPENDENT_AMBULATORY_CARE_PROVIDER_SITE_OTHER): Payer: BC Managed Care – PPO | Admitting: *Deleted

## 2022-07-26 DIAGNOSIS — J309 Allergic rhinitis, unspecified: Secondary | ICD-10-CM

## 2022-07-31 ENCOUNTER — Ambulatory Visit (INDEPENDENT_AMBULATORY_CARE_PROVIDER_SITE_OTHER): Payer: BC Managed Care – PPO

## 2022-07-31 DIAGNOSIS — J309 Allergic rhinitis, unspecified: Secondary | ICD-10-CM | POA: Diagnosis not present

## 2022-08-09 ENCOUNTER — Ambulatory Visit (INDEPENDENT_AMBULATORY_CARE_PROVIDER_SITE_OTHER): Payer: BC Managed Care – PPO | Admitting: *Deleted

## 2022-08-09 DIAGNOSIS — J309 Allergic rhinitis, unspecified: Secondary | ICD-10-CM | POA: Diagnosis not present

## 2022-08-16 ENCOUNTER — Ambulatory Visit (INDEPENDENT_AMBULATORY_CARE_PROVIDER_SITE_OTHER): Payer: BC Managed Care – PPO | Admitting: *Deleted

## 2022-08-16 DIAGNOSIS — J309 Allergic rhinitis, unspecified: Secondary | ICD-10-CM

## 2022-08-23 ENCOUNTER — Ambulatory Visit (INDEPENDENT_AMBULATORY_CARE_PROVIDER_SITE_OTHER): Payer: BC Managed Care – PPO | Admitting: *Deleted

## 2022-08-23 DIAGNOSIS — J309 Allergic rhinitis, unspecified: Secondary | ICD-10-CM

## 2022-08-30 ENCOUNTER — Ambulatory Visit (INDEPENDENT_AMBULATORY_CARE_PROVIDER_SITE_OTHER): Payer: BC Managed Care – PPO

## 2022-08-30 DIAGNOSIS — J309 Allergic rhinitis, unspecified: Secondary | ICD-10-CM | POA: Diagnosis not present

## 2022-09-06 ENCOUNTER — Ambulatory Visit (INDEPENDENT_AMBULATORY_CARE_PROVIDER_SITE_OTHER): Payer: BC Managed Care – PPO

## 2022-09-06 DIAGNOSIS — J309 Allergic rhinitis, unspecified: Secondary | ICD-10-CM | POA: Diagnosis not present

## 2022-09-13 ENCOUNTER — Ambulatory Visit (INDEPENDENT_AMBULATORY_CARE_PROVIDER_SITE_OTHER): Payer: BC Managed Care – PPO

## 2022-09-13 DIAGNOSIS — J309 Allergic rhinitis, unspecified: Secondary | ICD-10-CM

## 2022-09-20 ENCOUNTER — Ambulatory Visit (INDEPENDENT_AMBULATORY_CARE_PROVIDER_SITE_OTHER): Payer: BC Managed Care – PPO

## 2022-09-20 DIAGNOSIS — J309 Allergic rhinitis, unspecified: Secondary | ICD-10-CM

## 2022-09-27 ENCOUNTER — Ambulatory Visit (INDEPENDENT_AMBULATORY_CARE_PROVIDER_SITE_OTHER): Payer: BC Managed Care – PPO

## 2022-09-27 DIAGNOSIS — J309 Allergic rhinitis, unspecified: Secondary | ICD-10-CM

## 2022-10-04 ENCOUNTER — Ambulatory Visit (INDEPENDENT_AMBULATORY_CARE_PROVIDER_SITE_OTHER): Payer: BC Managed Care – PPO

## 2022-10-04 DIAGNOSIS — J309 Allergic rhinitis, unspecified: Secondary | ICD-10-CM | POA: Diagnosis not present

## 2022-10-07 ENCOUNTER — Ambulatory Visit (INDEPENDENT_AMBULATORY_CARE_PROVIDER_SITE_OTHER): Payer: BC Managed Care – PPO

## 2022-10-07 DIAGNOSIS — J309 Allergic rhinitis, unspecified: Secondary | ICD-10-CM | POA: Diagnosis not present

## 2022-10-18 ENCOUNTER — Ambulatory Visit (INDEPENDENT_AMBULATORY_CARE_PROVIDER_SITE_OTHER): Payer: BC Managed Care – PPO

## 2022-10-18 DIAGNOSIS — J309 Allergic rhinitis, unspecified: Secondary | ICD-10-CM | POA: Diagnosis not present

## 2022-10-18 NOTE — Progress Notes (Signed)
EXP 10/19/23 

## 2022-10-21 DIAGNOSIS — J3089 Other allergic rhinitis: Secondary | ICD-10-CM | POA: Diagnosis not present

## 2022-10-25 ENCOUNTER — Ambulatory Visit (INDEPENDENT_AMBULATORY_CARE_PROVIDER_SITE_OTHER): Payer: BC Managed Care – PPO

## 2022-10-25 DIAGNOSIS — J309 Allergic rhinitis, unspecified: Secondary | ICD-10-CM

## 2022-11-01 ENCOUNTER — Ambulatory Visit (INDEPENDENT_AMBULATORY_CARE_PROVIDER_SITE_OTHER): Payer: BC Managed Care – PPO

## 2022-11-01 DIAGNOSIS — J309 Allergic rhinitis, unspecified: Secondary | ICD-10-CM | POA: Diagnosis not present

## 2022-11-06 ENCOUNTER — Ambulatory Visit (INDEPENDENT_AMBULATORY_CARE_PROVIDER_SITE_OTHER): Payer: BC Managed Care – PPO

## 2022-11-06 DIAGNOSIS — J309 Allergic rhinitis, unspecified: Secondary | ICD-10-CM

## 2022-11-07 ENCOUNTER — Ambulatory Visit: Payer: BC Managed Care – PPO | Admitting: Internal Medicine

## 2022-11-18 ENCOUNTER — Ambulatory Visit (INDEPENDENT_AMBULATORY_CARE_PROVIDER_SITE_OTHER): Payer: BC Managed Care – PPO

## 2022-11-18 DIAGNOSIS — J309 Allergic rhinitis, unspecified: Secondary | ICD-10-CM

## 2022-11-20 NOTE — Progress Notes (Signed)
FOLLOW UP Date of Service/Encounter:  11/25/22   Subjective:  Tara Prince (DOB: May 11, 1976) is a 46 y.o. female who [facility] in re-evaluation of the following: intermittent asthma, allergic rhinitis, shellfish allergy History obtained from: chart review and patient.  For Review, LV was on 05/06/22  with Dr.Tyneshia Stivers seen for routine follow-up. She was tolerating AIT, but not noticing much benefit yet. She was planning for upcoming turbinate reduction .  --------------------------------------- Today presents for follow-up. She has now started her maintenance vial, but has not reached 0.5 mL dosing. She has had some large local reactions, but no other systemic symptoms. She is not feeling much difference with the injections yet.   She is able to breath much better from her nose after her turbinate reduction surgery.  She does feel the nasal sprays are very helpful and more so now following surgery.  She is having large local reactions frequently with her injections.  She takes xyzal on the days she comes.  She is not using topical steroids or increasing the dose on those days.  She does report asthma is controlled.  She is doing Pilates exercise now which is less impact and has not really used her albuterol inhaler.  ---------------------------------------- Pertinent History/Diagnostics:  Asthma: Exercise-induced with VCD overlap? (Didn't find VCD exercises helpful)                --Normal spirometry (11/02/2021): ratio 100 %, 2.77 L FEV1  --2018 Spirometry: Normal. No significant postbronchodilator improvement. Allergic Rhinitis: Started allergy shots on 11/30/2021. Turbinate reduction in May 2023.  -- SPT environmental panel (11/02/2021): Borderline to dust mite, mixed feathers, cockroach --Intradermal testing (11/02/2021): Positive to [place], [place], ragweed mix, weed mix, tree mix, mold 2, dog - -2018 Epicutaneous testing:  Negative  despite a positive histamine control. --2018 Intradermal testing: Positive to molds, dog epithelia, cockroach antigen, and dust mite antigen. Food Allergy (shellfish) - Hx of reaction: Calamari, mussels cause her to have extreme itching without other symptoms, tolerates crustaceans and oysters.  Tolerates finned fish.   Has lactose intolerance - SPT select foods (11/02/2021): Shellfish panel negative -Labs ordered for squid and Octopus but not obtained  Allergies as of 11/25/2022       Reactions   Topiramate Other (See Comments)   headache headache   Amoxicillin Rash   Septra [sulfamethoxazole-trimethoprim] Rash        Medication List        Accurate as of November 25, 2022  1:09 PM. If you have any questions, ask your nurse or doctor.          albuterol 108 (90 Base) MCG/ACT inhaler Commonly known as: VENTOLIN HFA Inhale into the lungs.   B COMPLEX 100 PO Take 1 tablet by mouth daily.   Biotin 1000 MCG tablet Take by mouth.   Botox 200 units injection Generic drug: botulinum toxin Type A SMARTSIG:200 Unit(s) IM   busPIRone 5 MG tablet Commonly known as: BUSPAR Take 5 mg by mouth daily as needed.   co-enzyme Q-10 30 MG capsule Take by mouth.   diclofenac 50 MG tablet Commonly known as: CATAFLAM Take 1 tablet at onset of headache. May repeat in 1 hour if still present.   escitalopram 20 MG tablet Commonly known as: LEXAPRO Take 20 mg by mouth daily.   ipratropium 0.03 % nasal spray Commonly known as: ATROVENT Place 2 sprays into both nostrils 3 (three) times daily as needed for rhinitis.  levocetirizine 5 MG tablet Commonly known as: XYZAL Take 5 mg by mouth every evening.   Magnesium 500 MG Caps Take 1 capsule by mouth daily.   montelukast 10 MG tablet Commonly known as: SINGULAIR Take 10 mg by mouth at bedtime.   Multi-Vitamins Tabs Take by mouth.   NP Thyroid 90 MG tablet Generic drug: thyroid Take 90 mg by mouth daily.   Nurtec 75  MG Tbdp Generic drug: Rimegepant Sulfate Take by mouth.   Ozempic (1 MG/DOSE) 4 MG/3ML Sopn Generic drug: Semaglutide (1 MG/DOSE) Inject into the skin.   progesterone 200 MG capsule Commonly known as: PROMETRIUM Take 200 mg by mouth daily.   Sodium Fluoride 5000 Sensitive 1.1-5 % Gel Generic drug: Sod Fluoride-Potassium Nitrate Take by mouth.   spironolactone 100 MG tablet Commonly known as: ALDACTONE Take 100 mg by mouth 2 (two) times daily.   tiZANidine 4 MG tablet Commonly known as: ZANAFLEX Take 4 mg by mouth every 6 (six) hours as needed.   TURMERIC CURCUMIN PO Take 400 mg by mouth daily.   Vitamin D3 125 MCG (5000 UT) Caps Take 1 capsule by mouth daily.       Past Medical History:  Diagnosis Date   Allergic rhinitis    Alopecia    Anxiety    Asthma    Back pain    Depression    Dyspnea on exertion    GERD (gastroesophageal reflux disease)    Hypothyroidism    Lactose intolerance    Obesity    PCOS (polycystic ovarian syndrome)    No past surgical history on file. Otherwise, there have been no changes to her past medical history, surgical history, family history, or social history.  ROS: All others negative except as noted per HPI.   Objective:  BP 118/72 (BP Location: Left Arm, Patient Position: Sitting, Cuff Size: Normal)   Pulse 79   Temp 98.7 F (37.1 C) (Temporal)   Resp 18   Wt 191 lb 1.6 oz (86.7 kg)   SpO2 99%   BMI 31.32 kg/m  Body mass index is 31.32 kg/m. Physical Exam: General Appearance:  Alert, cooperative, no distress, appears stated age  Head:  Normocephalic, without obvious abnormality, atraumatic  Eyes:  Conjunctiva clear, EOM's intact  Nose: Nares normal, normal mucosa, no visible anterior polyps, and septum midline  Throat: Lips, tongue normal; teeth and gums normal, normal posterior oropharynx  Neck: Supple, symmetrical  Lungs:   clear to auscultation bilaterally, Respirations unlabored, no coughing  Heart:  regular  rate and rhythm and no murmur, Appears well perfused  Extremities: No edema  Skin: Skin color, texture, turgor normal, raised erythematous edematous areas on bilateral upper arms at site of previous allergy injections  Neurologic: No gross deficits   Assessment/Plan   Seasonal and perennial allergic rhinitis -- allergen avoidance directed at grasses, weeds, ragweed, trees, molds, dust mites, dog, cockroach and mixed feathers - continue allergy shots per protocol  -Discussed doubling up on antihistamines on day of allergy visit as well as using topical steroids for large local reactions - Continue Nasal Steroid Spray: Options include Flonase (fluticasone), Nasocort (triamcinolone), Nasonex (mometasome) 1- 2 sprays in each nostril daily (can buy over-the-counter if not covered by insurance)  Best results if used daily. - Continue Atrovent (Ipratropium Bromide) 1-2 sprays in each nostril up to 3 times a day as needed for runny nose/post nasal drip/drainage.  Use less frequently if airway gets too dry. - Continue Singulair (Montelukast) 10mg  daily  -  Continue over the counter antihistamine daily or daily as needed.   -Your options include Zyrtec (Cetirizine) 10mg , Claritin (Loratadine) 10mg , Allegra (Fexofenadine) 180mg , or Xyzal (Levocetirinze) 5mg  - use saline nasal spray -Continue to follow with ENT  Exercise Induced Asthma: - Use Albuterol (Proair/Ventolin) 2 puffs every 4-6 hours as needed for chest tightness, wheezing, or coughing and 2 puffs 15 minutes prior to exercise if you have symptoms with activity - Use a spacer with all inhalers - Asthma is not controlled if:  - Symptoms are occurring >2 times a week OR  - >2 times a month nighttime awakenings  - Please call the clinic to schedule a follow up if these symptoms arise  Food allergy:  - skin testing was negative to shellfish - please strictly avoid calamari (squid) and octopus (okay to continue eating the other shellfish you are  already tolerating) - consider acquiring testing for squid and octupus in blood and if negative, can do oral challenge  Mosquito/insect bites Avoidance measures (DEET repellant for mosquitos, long clothing, etc) If bite occurs with raised rash:  - For itch: Topical steroid (hydrocortisone cream) twice daily as needed + oral antihistamine (zyrtec) - For pain and swelling: Oral anti-inflammatory (ibuprofen), ice affected area  Follow-up in 6 months, sooner if needed.  It was so great seeing you again!   , MD  [facility]

## 2022-11-25 ENCOUNTER — Encounter: Payer: Self-pay | Admitting: Internal Medicine

## 2022-11-25 ENCOUNTER — Ambulatory Visit (INDEPENDENT_AMBULATORY_CARE_PROVIDER_SITE_OTHER): Payer: BC Managed Care – PPO | Admitting: Internal Medicine

## 2022-11-25 VITALS — BP 118/72 | HR 79 | Temp 98.7°F | Resp 18 | Wt 191.1 lb

## 2022-11-25 DIAGNOSIS — J3089 Other allergic rhinitis: Secondary | ICD-10-CM | POA: Diagnosis not present

## 2022-11-25 DIAGNOSIS — T7800XD Anaphylactic reaction due to unspecified food, subsequent encounter: Secondary | ICD-10-CM

## 2022-11-25 DIAGNOSIS — J4520 Mild intermittent asthma, uncomplicated: Secondary | ICD-10-CM | POA: Diagnosis not present

## 2022-11-25 DIAGNOSIS — T7800XA Anaphylactic reaction due to unspecified food, initial encounter: Secondary | ICD-10-CM

## 2022-11-25 NOTE — Patient Instructions (Addendum)
Seasonal and perennial allergic rhinitis -- allergen avoidance directed at grasses, weeds, ragweed, trees, molds, dust mites, dog, cockroach and mixed feathers - continue allergy shots per protocol - Continue Nasal Steroid Spray: Options include Flonase (fluticasone), Nasocort (triamcinolone), Nasonex (mometasome) 1- 2 sprays in each nostril daily (can buy over-the-counter if not covered by insurance)  Best results if used daily. - Continue Atrovent (Ipratropium Bromide) 1-2 sprays in each nostril up to 3 times a day as needed for runny nose/post nasal drip/drainage.  Use less frequently if airway gets too dry. - Continue Singulair (Montelukast) 10mg  daily  - Continue over the counter antihistamine daily or daily as needed.   -Your options include Zyrtec (Cetirizine) 10mg , Claritin (Loratadine) 10mg , Allegra (Fexofenadine) 180mg , or Xyzal (Levocetirinze) 5mg  - use saline nasal spray -Continue to follow with ENT  Exercise Induced Asthma: - Use Albuterol (Proair/Ventolin) 2 puffs every 4-6 hours as needed for chest tightness, wheezing, or coughing and 2 puffs 15 minutes prior to exercise if you have symptoms with activity - Use a spacer with all inhalers - Asthma is not controlled if:  - Symptoms are occurring >2 times a week OR  - >2 times a month nighttime awakenings  - Please call the clinic to schedule a follow up if these symptoms arise  Food allergy:  - skin testing was negative to shellfish - please strictly avoid calamari (squid) and octopus (okay to continue eating the other shellfish you are already tolerating) - consider acquiring testing for squid and octupus in blood and if negative, can do oral challenge  Mosquito/insect bites Avoidance measures (DEET repellant for mosquitos, long clothing, etc) If bite occurs with raised rash:  - For itch: Topical steroid (hydrocortisone cream) twice daily as needed + oral antihistamine (zyrtec) - For pain and swelling: Oral anti-inflammatory  (ibuprofen), ice affected area  Follow-up in 6 months, sooner if needed.  It was so great seeing you again!

## 2022-11-29 ENCOUNTER — Ambulatory Visit: Payer: Self-pay

## 2022-12-13 ENCOUNTER — Ambulatory Visit (INDEPENDENT_AMBULATORY_CARE_PROVIDER_SITE_OTHER): Payer: BC Managed Care – PPO

## 2022-12-13 DIAGNOSIS — J309 Allergic rhinitis, unspecified: Secondary | ICD-10-CM

## 2022-12-20 ENCOUNTER — Ambulatory Visit (INDEPENDENT_AMBULATORY_CARE_PROVIDER_SITE_OTHER): Payer: BC Managed Care – PPO

## 2022-12-20 DIAGNOSIS — J309 Allergic rhinitis, unspecified: Secondary | ICD-10-CM

## 2023-01-03 ENCOUNTER — Ambulatory Visit (INDEPENDENT_AMBULATORY_CARE_PROVIDER_SITE_OTHER): Payer: BC Managed Care – PPO

## 2023-01-03 DIAGNOSIS — J309 Allergic rhinitis, unspecified: Secondary | ICD-10-CM | POA: Diagnosis not present

## 2023-01-10 ENCOUNTER — Ambulatory Visit (INDEPENDENT_AMBULATORY_CARE_PROVIDER_SITE_OTHER): Payer: BC Managed Care – PPO

## 2023-01-10 DIAGNOSIS — J309 Allergic rhinitis, unspecified: Secondary | ICD-10-CM | POA: Diagnosis not present

## 2023-01-17 ENCOUNTER — Ambulatory Visit (INDEPENDENT_AMBULATORY_CARE_PROVIDER_SITE_OTHER): Payer: BC Managed Care – PPO

## 2023-01-17 DIAGNOSIS — J309 Allergic rhinitis, unspecified: Secondary | ICD-10-CM

## 2023-06-05 ENCOUNTER — Ambulatory Visit: Payer: BC Managed Care – PPO | Admitting: Internal Medicine

## 2023-08-20 IMAGING — CT CT MAXILLOFACIAL W/O CM
1 series · 14 of 30 positions shown, 18 images · non-contrast
Comparison: None.

CLINICAL DATA: 46-year-old female with unilateral nasal congestion.
History of chronic sinusitis and allergic rhinitis. Query polyp or
anatomic abnormality.

EXAM:
CT MAXILLOFACIAL WITHOUT CONTRAST
TECHNIQUE: Multidetector CT images of the paranasal sinuses were obtained using
the standard protocol without intravenous contrast.
RADIATION DOSE REDUCTION: This exam was performed according to the
departmental dose-optimization program which includes automated
exposure control, adjustment of the mA and/or kV according to
patient size and/or use of iterative reconstruction technique.

[Series 4: soft tissue · axial · 0.45mm/px · z∈[+1045,+1157]mm · 14 of 126 slices shown, 18 images]
[im 9/126  brain]
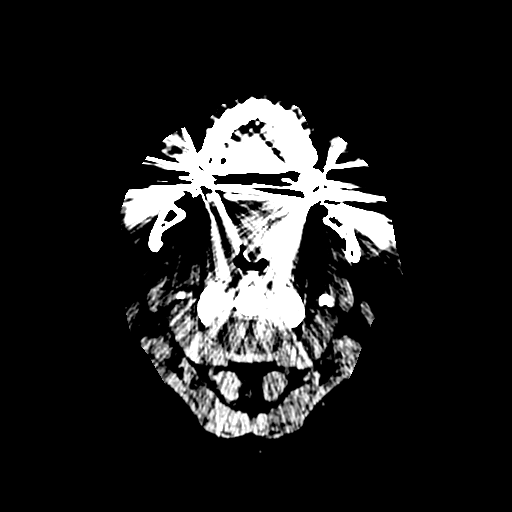
[im 9/126  bone]
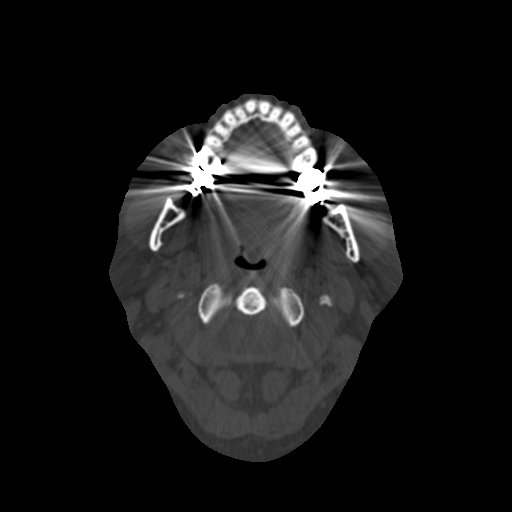
[im 18/126  bone]
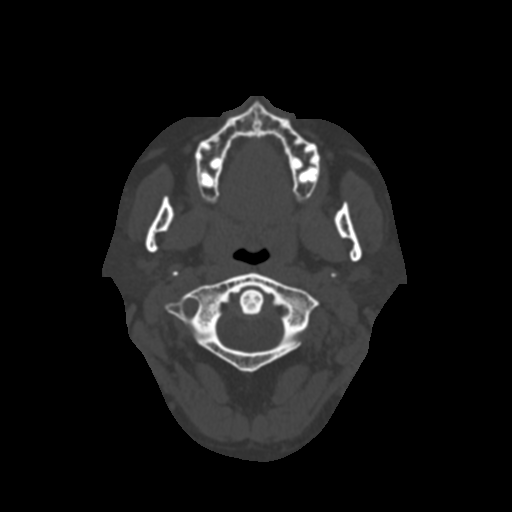
[im 26/126  bone]
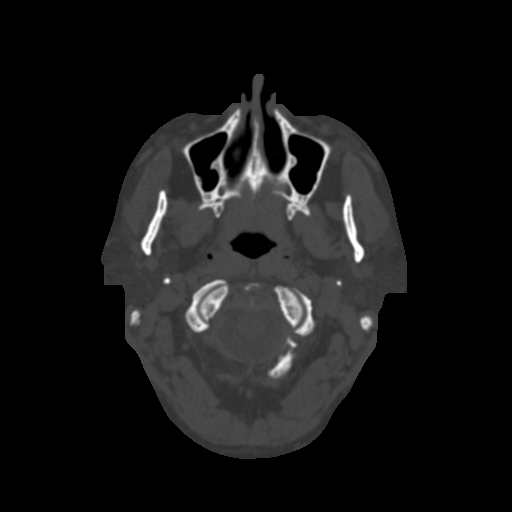
[im 35/126  bone]
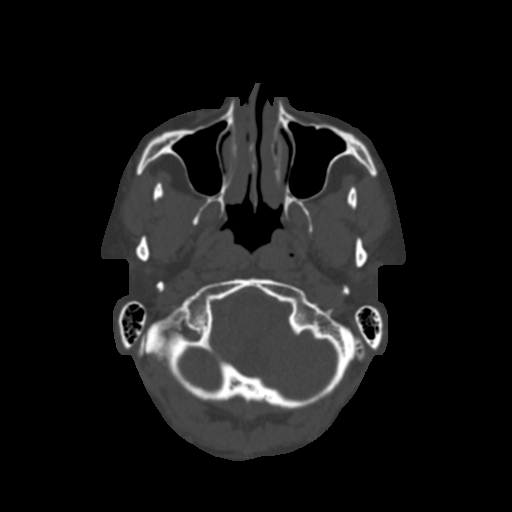
[im 44/126  brain]
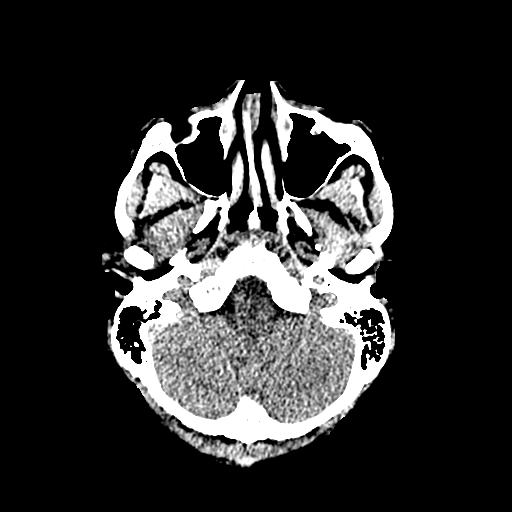
[im 44/126  bone]
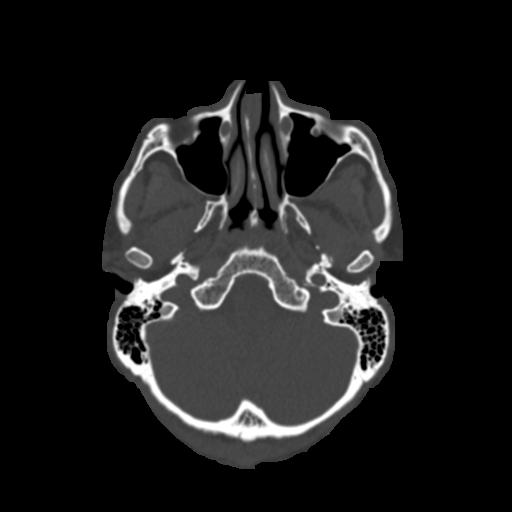
[im 52/126  bone]
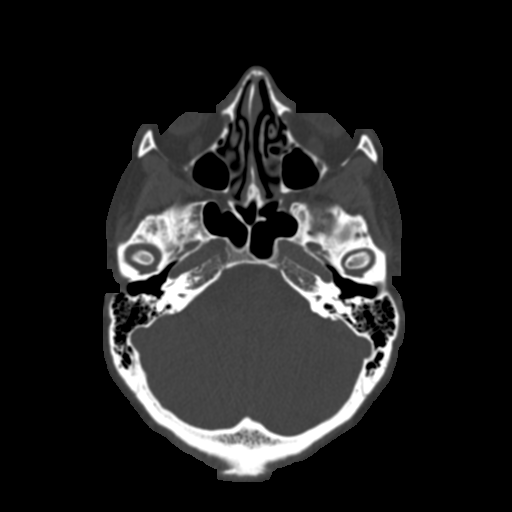
[im 61/126  bone]
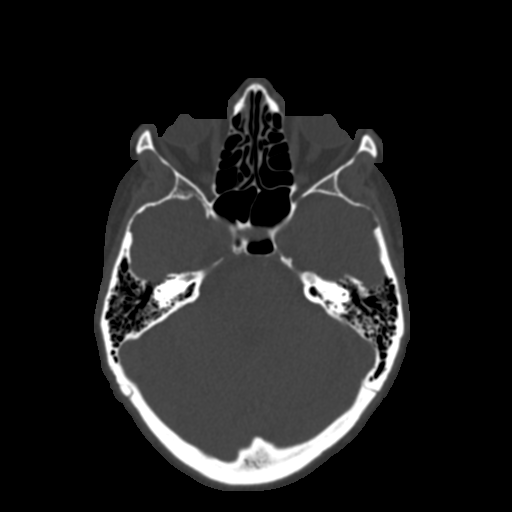
[im 69/126  bone]
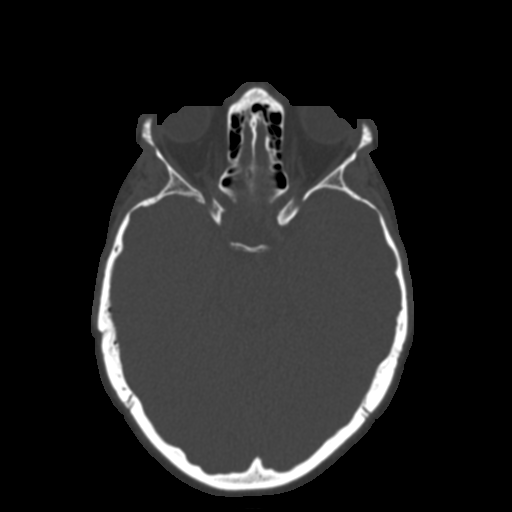
[im 78/126  brain]
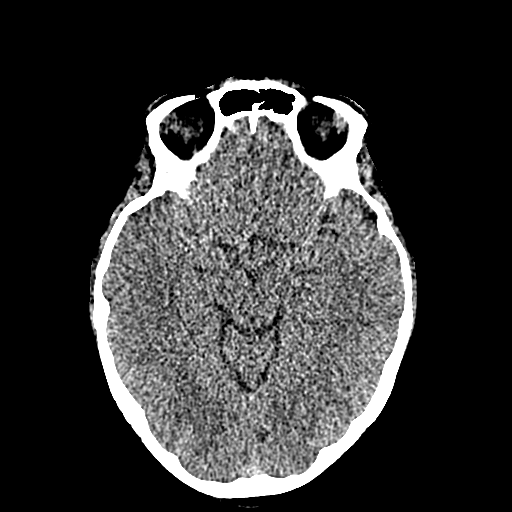
[im 78/126  bone]
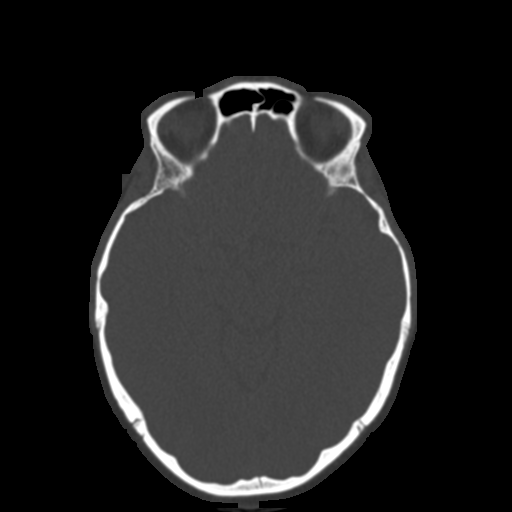
[im 87/126  bone]
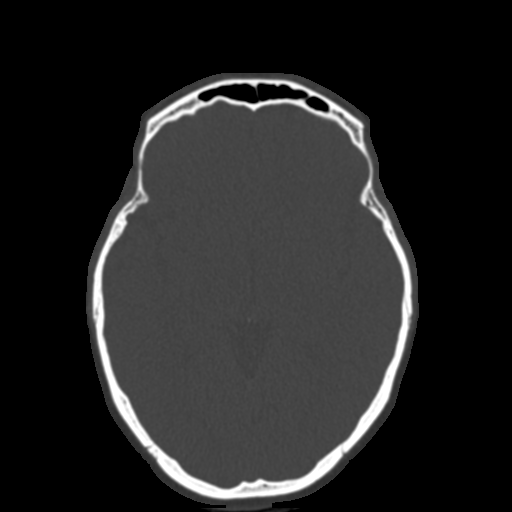
[im 95/126  bone]
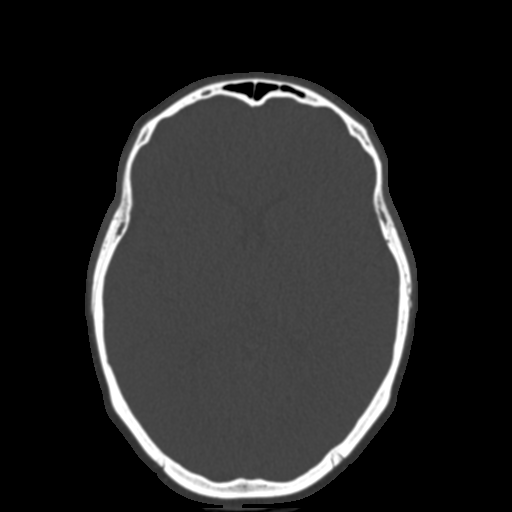
[im 104/126  bone]
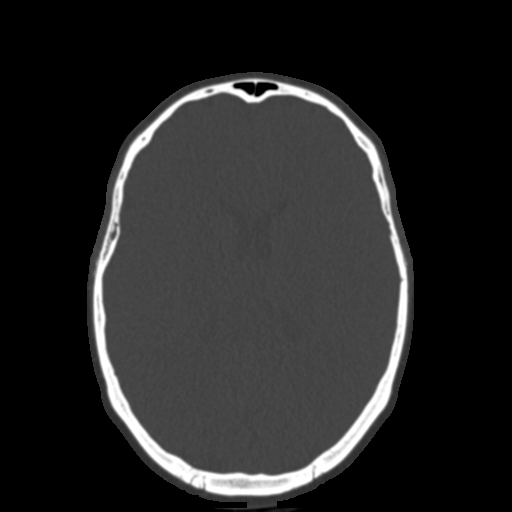
[im 113/126  brain]
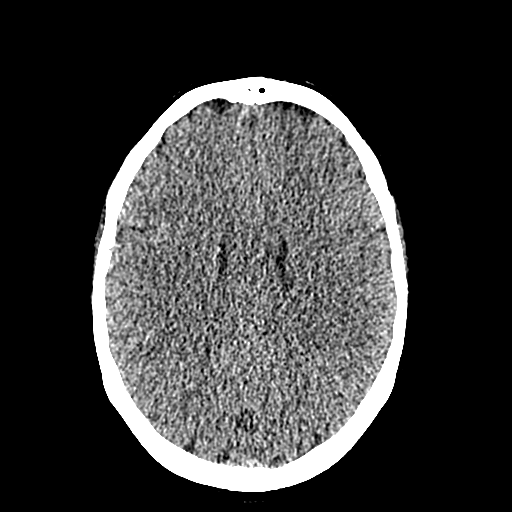
[im 113/126  bone]
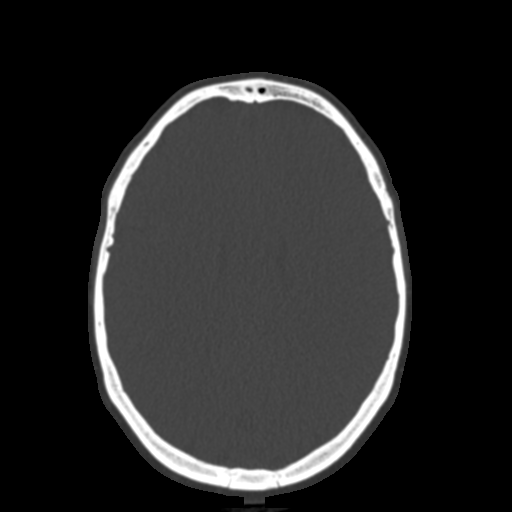
[im 121/126  bone]
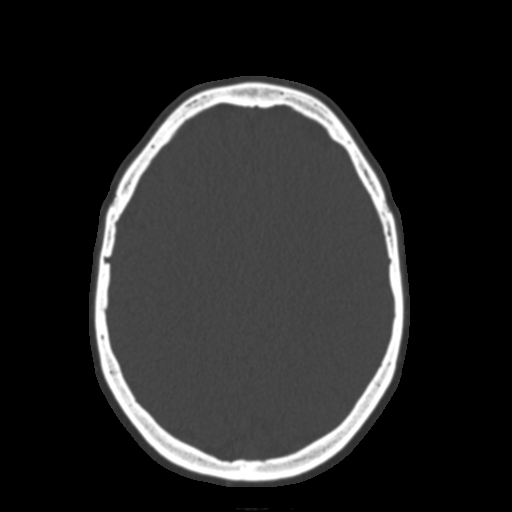

[14 of 30 positions shown; findings below may reference images not displayed]

FINDINGS: Paranasal sinuses:

Frontal: Normally aerated. Patent frontal sinus drainage pathways.

Ethmoid: Normally aerated.

Maxillary: Normally aerated.

Sphenoid: Hyperplastic normally aerated. Patent sphenoid ostia
(series 3, image 63).

Right ostiomeatal unit: Patent, coronal image 26.

Left ostiomeatal unit: Patent, same image.

Nasal passages: Nasal septum is intact with rightward septal
deviation. There is symmetric bilateral nasal cavity mucosal
thickening (coronal image 31). No discrete nasal polyp or mass.
Trace retained secretions in the bilateral inferior meatus.
Olfactory recesses are clear. Negative nasopharynx.

Anatomy:

Anterior ethmoidal artery position on coronal image 27 with no
pneumatization superior to either notch.

Fairly symmetric and intact olfactory grooves and fovea ethmoidalis,
Keros II (4-7mm).

Sellar sphenoid pneumatization pattern. No clinoid process
pneumatization, but difficult to exclude bilateral focal carotid
canal dehiscence on coronal image 42. Uncovering of the right vidian
canal (image 43). No Onodi cell.

Other: Visible noncontrast brain parenchyma is negative. The right
vertebral artery appears dominant.

Visualized orbits and scalp soft tissues are within normal limits.
Negative visible noncontrast deep soft tissue spaces of the face.

No acute maxillary dental finding. Bilateral tympanic cavities and
mastoid air cells are clear. No acute osseous abnormality
identified.
IMPRESSION: 1. Mildly hyperplastic and normally aerated paranasal sinuses.
Patent sinus drainage pathways.
2. Symmetric nasal cavity mucosal thickening raising the possibility
of Rhinitis. Rightward septal deviation. No nasal polyp or mass.
